# Patient Record
Sex: Female | Born: 1955
Health system: Southern US, Community
[De-identification: ages and names within clinical notes are randomized; demographics above are authoritative.]

## PROBLEM LIST (undated history)

## (undated) DIAGNOSIS — E785 Hyperlipidemia, unspecified: Secondary | ICD-10-CM

## (undated) DIAGNOSIS — N186 End stage renal disease: Secondary | ICD-10-CM

## (undated) DIAGNOSIS — F329 Major depressive disorder, single episode, unspecified: Secondary | ICD-10-CM

## (undated) DIAGNOSIS — I10 Essential (primary) hypertension: Secondary | ICD-10-CM

## (undated) DIAGNOSIS — M109 Gout, unspecified: Secondary | ICD-10-CM

## (undated) DIAGNOSIS — M329 Systemic lupus erythematosus, unspecified: Secondary | ICD-10-CM

## (undated) DIAGNOSIS — E559 Vitamin D deficiency, unspecified: Secondary | ICD-10-CM

## (undated) HISTORY — PX: TUBAL LIGATION: SHX77

---

## 2011-08-22 DIAGNOSIS — N2581 Secondary hyperparathyroidism of renal origin: Secondary | ICD-10-CM | POA: Insufficient documentation

## 2011-08-22 DIAGNOSIS — E669 Obesity, unspecified: Secondary | ICD-10-CM | POA: Insufficient documentation

## 2011-08-27 DIAGNOSIS — I951 Orthostatic hypotension: Secondary | ICD-10-CM | POA: Insufficient documentation

## 2014-01-10 DIAGNOSIS — J309 Allergic rhinitis, unspecified: Secondary | ICD-10-CM | POA: Insufficient documentation

## 2014-01-10 DIAGNOSIS — E785 Hyperlipidemia, unspecified: Secondary | ICD-10-CM | POA: Insufficient documentation

## 2014-01-10 DIAGNOSIS — IMO0001 Reserved for inherently not codable concepts without codable children: Secondary | ICD-10-CM | POA: Insufficient documentation

## 2014-01-10 DIAGNOSIS — E559 Vitamin D deficiency, unspecified: Secondary | ICD-10-CM | POA: Insufficient documentation

## 2014-01-10 DIAGNOSIS — M329 Systemic lupus erythematosus, unspecified: Secondary | ICD-10-CM | POA: Insufficient documentation

## 2014-01-10 DIAGNOSIS — F17200 Nicotine dependence, unspecified, uncomplicated: Secondary | ICD-10-CM | POA: Insufficient documentation

## 2014-01-13 DIAGNOSIS — R87613 High grade squamous intraepithelial lesion on cytologic smear of cervix (HGSIL): Secondary | ICD-10-CM | POA: Insufficient documentation

## 2014-04-19 DIAGNOSIS — K828 Other specified diseases of gallbladder: Secondary | ICD-10-CM | POA: Insufficient documentation

## 2014-05-07 DIAGNOSIS — G4700 Insomnia, unspecified: Secondary | ICD-10-CM | POA: Insufficient documentation

## 2014-05-07 DIAGNOSIS — F331 Major depressive disorder, recurrent, moderate: Secondary | ICD-10-CM | POA: Insufficient documentation

## 2014-09-01 DIAGNOSIS — M10371 Gout due to renal impairment, right ankle and foot: Secondary | ICD-10-CM | POA: Insufficient documentation

## 2016-10-01 DIAGNOSIS — N186 End stage renal disease: Secondary | ICD-10-CM | POA: Diagnosis not present

## 2016-10-01 DIAGNOSIS — N2581 Secondary hyperparathyroidism of renal origin: Secondary | ICD-10-CM | POA: Diagnosis not present

## 2016-10-03 DIAGNOSIS — N2581 Secondary hyperparathyroidism of renal origin: Secondary | ICD-10-CM | POA: Diagnosis not present

## 2016-10-03 DIAGNOSIS — N186 End stage renal disease: Secondary | ICD-10-CM | POA: Diagnosis not present

## 2016-10-15 DIAGNOSIS — N186 End stage renal disease: Secondary | ICD-10-CM | POA: Diagnosis not present

## 2016-10-15 DIAGNOSIS — N2581 Secondary hyperparathyroidism of renal origin: Secondary | ICD-10-CM | POA: Diagnosis not present

## 2016-10-17 DIAGNOSIS — N186 End stage renal disease: Secondary | ICD-10-CM | POA: Diagnosis not present

## 2016-10-17 DIAGNOSIS — N2581 Secondary hyperparathyroidism of renal origin: Secondary | ICD-10-CM | POA: Diagnosis not present

## 2016-10-22 DIAGNOSIS — N186 End stage renal disease: Secondary | ICD-10-CM | POA: Diagnosis not present

## 2016-10-22 DIAGNOSIS — N2581 Secondary hyperparathyroidism of renal origin: Secondary | ICD-10-CM | POA: Diagnosis not present

## 2016-10-26 DIAGNOSIS — N186 End stage renal disease: Secondary | ICD-10-CM | POA: Diagnosis not present

## 2016-10-29 DIAGNOSIS — N2581 Secondary hyperparathyroidism of renal origin: Secondary | ICD-10-CM | POA: Diagnosis not present

## 2016-10-29 DIAGNOSIS — N186 End stage renal disease: Secondary | ICD-10-CM | POA: Diagnosis not present

## 2016-11-05 DIAGNOSIS — N186 End stage renal disease: Secondary | ICD-10-CM | POA: Diagnosis not present

## 2016-11-05 DIAGNOSIS — N2581 Secondary hyperparathyroidism of renal origin: Secondary | ICD-10-CM | POA: Diagnosis not present

## 2016-11-07 DIAGNOSIS — N186 End stage renal disease: Secondary | ICD-10-CM | POA: Diagnosis not present

## 2016-11-07 DIAGNOSIS — N2581 Secondary hyperparathyroidism of renal origin: Secondary | ICD-10-CM | POA: Diagnosis not present

## 2016-11-19 DIAGNOSIS — N186 End stage renal disease: Secondary | ICD-10-CM | POA: Diagnosis not present

## 2016-11-19 DIAGNOSIS — N2581 Secondary hyperparathyroidism of renal origin: Secondary | ICD-10-CM | POA: Diagnosis not present

## 2016-11-25 DIAGNOSIS — N186 End stage renal disease: Secondary | ICD-10-CM | POA: Diagnosis not present

## 2016-11-28 DIAGNOSIS — N2581 Secondary hyperparathyroidism of renal origin: Secondary | ICD-10-CM | POA: Diagnosis not present

## 2016-11-28 DIAGNOSIS — N186 End stage renal disease: Secondary | ICD-10-CM | POA: Diagnosis not present

## 2016-11-28 DIAGNOSIS — D631 Anemia in chronic kidney disease: Secondary | ICD-10-CM | POA: Diagnosis not present

## 2016-12-05 DIAGNOSIS — N186 End stage renal disease: Secondary | ICD-10-CM | POA: Diagnosis not present

## 2016-12-05 DIAGNOSIS — D631 Anemia in chronic kidney disease: Secondary | ICD-10-CM | POA: Diagnosis not present

## 2016-12-05 DIAGNOSIS — N2581 Secondary hyperparathyroidism of renal origin: Secondary | ICD-10-CM | POA: Diagnosis not present

## 2016-12-19 DIAGNOSIS — N2581 Secondary hyperparathyroidism of renal origin: Secondary | ICD-10-CM | POA: Diagnosis not present

## 2016-12-19 DIAGNOSIS — N186 End stage renal disease: Secondary | ICD-10-CM | POA: Diagnosis not present

## 2016-12-19 DIAGNOSIS — D631 Anemia in chronic kidney disease: Secondary | ICD-10-CM | POA: Diagnosis not present

## 2016-12-26 DIAGNOSIS — N2581 Secondary hyperparathyroidism of renal origin: Secondary | ICD-10-CM | POA: Diagnosis not present

## 2016-12-26 DIAGNOSIS — N186 End stage renal disease: Secondary | ICD-10-CM | POA: Diagnosis not present

## 2016-12-26 DIAGNOSIS — D631 Anemia in chronic kidney disease: Secondary | ICD-10-CM | POA: Diagnosis not present

## 2016-12-27 DIAGNOSIS — F3341 Major depressive disorder, recurrent, in partial remission: Secondary | ICD-10-CM | POA: Diagnosis not present

## 2016-12-27 DIAGNOSIS — Z992 Dependence on renal dialysis: Secondary | ICD-10-CM | POA: Diagnosis not present

## 2016-12-27 DIAGNOSIS — I10 Essential (primary) hypertension: Secondary | ICD-10-CM | POA: Diagnosis not present

## 2016-12-27 DIAGNOSIS — E785 Hyperlipidemia, unspecified: Secondary | ICD-10-CM | POA: Diagnosis not present

## 2016-12-27 DIAGNOSIS — M1A30X0 Chronic gout due to renal impairment, unspecified site, without tophus (tophi): Secondary | ICD-10-CM | POA: Diagnosis not present

## 2016-12-27 DIAGNOSIS — M329 Systemic lupus erythematosus, unspecified: Secondary | ICD-10-CM | POA: Diagnosis not present

## 2016-12-27 DIAGNOSIS — Z1211 Encounter for screening for malignant neoplasm of colon: Secondary | ICD-10-CM | POA: Diagnosis not present

## 2016-12-27 DIAGNOSIS — R87613 High grade squamous intraepithelial lesion on cytologic smear of cervix (HGSIL): Secondary | ICD-10-CM | POA: Diagnosis not present

## 2016-12-27 DIAGNOSIS — N186 End stage renal disease: Secondary | ICD-10-CM | POA: Diagnosis not present

## 2016-12-27 DIAGNOSIS — H1013 Acute atopic conjunctivitis, bilateral: Secondary | ICD-10-CM | POA: Diagnosis not present

## 2016-12-31 DIAGNOSIS — N186 End stage renal disease: Secondary | ICD-10-CM | POA: Diagnosis not present

## 2016-12-31 DIAGNOSIS — N2581 Secondary hyperparathyroidism of renal origin: Secondary | ICD-10-CM | POA: Diagnosis not present

## 2016-12-31 DIAGNOSIS — L299 Pruritus, unspecified: Secondary | ICD-10-CM | POA: Diagnosis not present

## 2016-12-31 DIAGNOSIS — D631 Anemia in chronic kidney disease: Secondary | ICD-10-CM | POA: Diagnosis not present

## 2017-01-02 DIAGNOSIS — L299 Pruritus, unspecified: Secondary | ICD-10-CM | POA: Diagnosis not present

## 2017-01-02 DIAGNOSIS — N186 End stage renal disease: Secondary | ICD-10-CM | POA: Diagnosis not present

## 2017-01-02 DIAGNOSIS — N2581 Secondary hyperparathyroidism of renal origin: Secondary | ICD-10-CM | POA: Diagnosis not present

## 2017-01-02 DIAGNOSIS — D631 Anemia in chronic kidney disease: Secondary | ICD-10-CM | POA: Diagnosis not present

## 2017-01-25 DIAGNOSIS — N186 End stage renal disease: Secondary | ICD-10-CM | POA: Diagnosis not present

## 2017-01-28 DIAGNOSIS — N186 End stage renal disease: Secondary | ICD-10-CM | POA: Diagnosis not present

## 2017-01-28 DIAGNOSIS — N2581 Secondary hyperparathyroidism of renal origin: Secondary | ICD-10-CM | POA: Diagnosis not present

## 2017-01-28 DIAGNOSIS — M899 Disorder of bone, unspecified: Secondary | ICD-10-CM | POA: Diagnosis not present

## 2017-01-28 DIAGNOSIS — D631 Anemia in chronic kidney disease: Secondary | ICD-10-CM | POA: Diagnosis not present

## 2017-02-11 DIAGNOSIS — N2581 Secondary hyperparathyroidism of renal origin: Secondary | ICD-10-CM | POA: Diagnosis not present

## 2017-02-11 DIAGNOSIS — D631 Anemia in chronic kidney disease: Secondary | ICD-10-CM | POA: Diagnosis not present

## 2017-02-11 DIAGNOSIS — N186 End stage renal disease: Secondary | ICD-10-CM | POA: Diagnosis not present

## 2017-02-11 DIAGNOSIS — M899 Disorder of bone, unspecified: Secondary | ICD-10-CM | POA: Diagnosis not present

## 2017-02-13 DIAGNOSIS — N2581 Secondary hyperparathyroidism of renal origin: Secondary | ICD-10-CM | POA: Diagnosis not present

## 2017-02-13 DIAGNOSIS — M899 Disorder of bone, unspecified: Secondary | ICD-10-CM | POA: Diagnosis not present

## 2017-02-13 DIAGNOSIS — N186 End stage renal disease: Secondary | ICD-10-CM | POA: Diagnosis not present

## 2017-02-13 DIAGNOSIS — D631 Anemia in chronic kidney disease: Secondary | ICD-10-CM | POA: Diagnosis not present

## 2017-02-18 DIAGNOSIS — D631 Anemia in chronic kidney disease: Secondary | ICD-10-CM | POA: Diagnosis not present

## 2017-02-18 DIAGNOSIS — N2581 Secondary hyperparathyroidism of renal origin: Secondary | ICD-10-CM | POA: Diagnosis not present

## 2017-02-18 DIAGNOSIS — N186 End stage renal disease: Secondary | ICD-10-CM | POA: Diagnosis not present

## 2017-02-18 DIAGNOSIS — M899 Disorder of bone, unspecified: Secondary | ICD-10-CM | POA: Diagnosis not present

## 2017-02-25 DIAGNOSIS — N186 End stage renal disease: Secondary | ICD-10-CM | POA: Diagnosis not present

## 2017-03-11 DIAGNOSIS — N186 End stage renal disease: Secondary | ICD-10-CM | POA: Diagnosis not present

## 2017-03-11 DIAGNOSIS — M899 Disorder of bone, unspecified: Secondary | ICD-10-CM | POA: Diagnosis not present

## 2017-03-11 DIAGNOSIS — D631 Anemia in chronic kidney disease: Secondary | ICD-10-CM | POA: Diagnosis not present

## 2017-03-11 DIAGNOSIS — Z139 Encounter for screening, unspecified: Secondary | ICD-10-CM | POA: Diagnosis not present

## 2017-03-11 DIAGNOSIS — N2581 Secondary hyperparathyroidism of renal origin: Secondary | ICD-10-CM | POA: Diagnosis not present

## 2017-03-13 DIAGNOSIS — D631 Anemia in chronic kidney disease: Secondary | ICD-10-CM | POA: Diagnosis not present

## 2017-03-13 DIAGNOSIS — M899 Disorder of bone, unspecified: Secondary | ICD-10-CM | POA: Diagnosis not present

## 2017-03-13 DIAGNOSIS — L859 Epidermal thickening, unspecified: Secondary | ICD-10-CM | POA: Diagnosis not present

## 2017-03-13 DIAGNOSIS — M329 Systemic lupus erythematosus, unspecified: Secondary | ICD-10-CM | POA: Diagnosis not present

## 2017-03-13 DIAGNOSIS — N186 End stage renal disease: Secondary | ICD-10-CM | POA: Diagnosis not present

## 2017-03-13 DIAGNOSIS — M5440 Lumbago with sciatica, unspecified side: Secondary | ICD-10-CM | POA: Diagnosis not present

## 2017-03-13 DIAGNOSIS — G8929 Other chronic pain: Secondary | ICD-10-CM | POA: Diagnosis not present

## 2017-03-13 DIAGNOSIS — M47816 Spondylosis without myelopathy or radiculopathy, lumbar region: Secondary | ICD-10-CM | POA: Diagnosis not present

## 2017-03-13 DIAGNOSIS — Z139 Encounter for screening, unspecified: Secondary | ICD-10-CM | POA: Diagnosis not present

## 2017-03-13 DIAGNOSIS — N2581 Secondary hyperparathyroidism of renal origin: Secondary | ICD-10-CM | POA: Diagnosis not present

## 2017-03-16 DIAGNOSIS — M47816 Spondylosis without myelopathy or radiculopathy, lumbar region: Secondary | ICD-10-CM | POA: Insufficient documentation

## 2017-03-18 DIAGNOSIS — N186 End stage renal disease: Secondary | ICD-10-CM | POA: Diagnosis not present

## 2017-03-18 DIAGNOSIS — Z992 Dependence on renal dialysis: Secondary | ICD-10-CM | POA: Diagnosis not present

## 2017-03-18 DIAGNOSIS — I129 Hypertensive chronic kidney disease with stage 1 through stage 4 chronic kidney disease, or unspecified chronic kidney disease: Secondary | ICD-10-CM | POA: Diagnosis not present

## 2017-03-18 DIAGNOSIS — N2581 Secondary hyperparathyroidism of renal origin: Secondary | ICD-10-CM | POA: Diagnosis not present

## 2017-03-20 DIAGNOSIS — N2581 Secondary hyperparathyroidism of renal origin: Secondary | ICD-10-CM | POA: Diagnosis not present

## 2017-03-20 DIAGNOSIS — N186 End stage renal disease: Secondary | ICD-10-CM | POA: Diagnosis not present

## 2017-03-22 DIAGNOSIS — N186 End stage renal disease: Secondary | ICD-10-CM | POA: Diagnosis not present

## 2017-03-22 DIAGNOSIS — N2581 Secondary hyperparathyroidism of renal origin: Secondary | ICD-10-CM | POA: Diagnosis not present

## 2017-03-26 DIAGNOSIS — N186 End stage renal disease: Secondary | ICD-10-CM | POA: Diagnosis not present

## 2017-03-26 DIAGNOSIS — N2581 Secondary hyperparathyroidism of renal origin: Secondary | ICD-10-CM | POA: Diagnosis not present

## 2017-03-28 DIAGNOSIS — N186 End stage renal disease: Secondary | ICD-10-CM | POA: Diagnosis not present

## 2017-03-29 DIAGNOSIS — N186 End stage renal disease: Secondary | ICD-10-CM | POA: Diagnosis not present

## 2017-03-29 DIAGNOSIS — N2581 Secondary hyperparathyroidism of renal origin: Secondary | ICD-10-CM | POA: Diagnosis not present

## 2017-04-01 DIAGNOSIS — N186 End stage renal disease: Secondary | ICD-10-CM | POA: Diagnosis not present

## 2017-04-01 DIAGNOSIS — D631 Anemia in chronic kidney disease: Secondary | ICD-10-CM | POA: Diagnosis not present

## 2017-04-01 DIAGNOSIS — N2581 Secondary hyperparathyroidism of renal origin: Secondary | ICD-10-CM | POA: Diagnosis not present

## 2017-04-03 DIAGNOSIS — N2581 Secondary hyperparathyroidism of renal origin: Secondary | ICD-10-CM | POA: Diagnosis not present

## 2017-04-03 DIAGNOSIS — D631 Anemia in chronic kidney disease: Secondary | ICD-10-CM | POA: Diagnosis not present

## 2017-04-03 DIAGNOSIS — N186 End stage renal disease: Secondary | ICD-10-CM | POA: Diagnosis not present

## 2017-04-08 DIAGNOSIS — D631 Anemia in chronic kidney disease: Secondary | ICD-10-CM | POA: Diagnosis not present

## 2017-04-08 DIAGNOSIS — N186 End stage renal disease: Secondary | ICD-10-CM | POA: Diagnosis not present

## 2017-04-08 DIAGNOSIS — N2581 Secondary hyperparathyroidism of renal origin: Secondary | ICD-10-CM | POA: Diagnosis not present

## 2017-04-10 DIAGNOSIS — D631 Anemia in chronic kidney disease: Secondary | ICD-10-CM | POA: Diagnosis not present

## 2017-04-10 DIAGNOSIS — N2581 Secondary hyperparathyroidism of renal origin: Secondary | ICD-10-CM | POA: Diagnosis not present

## 2017-04-10 DIAGNOSIS — N186 End stage renal disease: Secondary | ICD-10-CM | POA: Diagnosis not present

## 2017-04-12 DIAGNOSIS — N186 End stage renal disease: Secondary | ICD-10-CM | POA: Diagnosis not present

## 2017-04-12 DIAGNOSIS — N2581 Secondary hyperparathyroidism of renal origin: Secondary | ICD-10-CM | POA: Diagnosis not present

## 2017-04-12 DIAGNOSIS — D631 Anemia in chronic kidney disease: Secondary | ICD-10-CM | POA: Diagnosis not present

## 2017-04-17 DIAGNOSIS — D631 Anemia in chronic kidney disease: Secondary | ICD-10-CM | POA: Diagnosis not present

## 2017-04-17 DIAGNOSIS — N2581 Secondary hyperparathyroidism of renal origin: Secondary | ICD-10-CM | POA: Diagnosis not present

## 2017-04-17 DIAGNOSIS — N186 End stage renal disease: Secondary | ICD-10-CM | POA: Diagnosis not present

## 2017-04-19 DIAGNOSIS — N186 End stage renal disease: Secondary | ICD-10-CM | POA: Diagnosis not present

## 2017-04-19 DIAGNOSIS — D631 Anemia in chronic kidney disease: Secondary | ICD-10-CM | POA: Diagnosis not present

## 2017-04-19 DIAGNOSIS — N2581 Secondary hyperparathyroidism of renal origin: Secondary | ICD-10-CM | POA: Diagnosis not present

## 2017-04-22 DIAGNOSIS — N186 End stage renal disease: Secondary | ICD-10-CM | POA: Diagnosis not present

## 2017-04-22 DIAGNOSIS — D631 Anemia in chronic kidney disease: Secondary | ICD-10-CM | POA: Diagnosis not present

## 2017-04-22 DIAGNOSIS — N2581 Secondary hyperparathyroidism of renal origin: Secondary | ICD-10-CM | POA: Diagnosis not present

## 2017-04-24 DIAGNOSIS — D631 Anemia in chronic kidney disease: Secondary | ICD-10-CM | POA: Diagnosis not present

## 2017-04-24 DIAGNOSIS — N186 End stage renal disease: Secondary | ICD-10-CM | POA: Diagnosis not present

## 2017-04-24 DIAGNOSIS — N2581 Secondary hyperparathyroidism of renal origin: Secondary | ICD-10-CM | POA: Diagnosis not present

## 2017-04-26 DIAGNOSIS — N2581 Secondary hyperparathyroidism of renal origin: Secondary | ICD-10-CM | POA: Diagnosis not present

## 2017-04-26 DIAGNOSIS — D631 Anemia in chronic kidney disease: Secondary | ICD-10-CM | POA: Diagnosis not present

## 2017-04-26 DIAGNOSIS — N186 End stage renal disease: Secondary | ICD-10-CM | POA: Diagnosis not present

## 2017-04-27 DIAGNOSIS — N186 End stage renal disease: Secondary | ICD-10-CM | POA: Diagnosis not present

## 2017-04-29 DIAGNOSIS — L299 Pruritus, unspecified: Secondary | ICD-10-CM | POA: Diagnosis not present

## 2017-04-29 DIAGNOSIS — N186 End stage renal disease: Secondary | ICD-10-CM | POA: Diagnosis not present

## 2017-04-29 DIAGNOSIS — D631 Anemia in chronic kidney disease: Secondary | ICD-10-CM | POA: Diagnosis not present

## 2017-04-29 DIAGNOSIS — M899 Disorder of bone, unspecified: Secondary | ICD-10-CM | POA: Diagnosis not present

## 2017-04-29 DIAGNOSIS — N2581 Secondary hyperparathyroidism of renal origin: Secondary | ICD-10-CM | POA: Diagnosis not present

## 2017-05-03 DIAGNOSIS — N2581 Secondary hyperparathyroidism of renal origin: Secondary | ICD-10-CM | POA: Diagnosis not present

## 2017-05-03 DIAGNOSIS — M899 Disorder of bone, unspecified: Secondary | ICD-10-CM | POA: Diagnosis not present

## 2017-05-03 DIAGNOSIS — N186 End stage renal disease: Secondary | ICD-10-CM | POA: Diagnosis not present

## 2017-05-03 DIAGNOSIS — L299 Pruritus, unspecified: Secondary | ICD-10-CM | POA: Diagnosis not present

## 2017-05-03 DIAGNOSIS — D631 Anemia in chronic kidney disease: Secondary | ICD-10-CM | POA: Diagnosis not present

## 2017-05-08 DIAGNOSIS — D631 Anemia in chronic kidney disease: Secondary | ICD-10-CM | POA: Diagnosis not present

## 2017-05-08 DIAGNOSIS — N186 End stage renal disease: Secondary | ICD-10-CM | POA: Diagnosis not present

## 2017-05-08 DIAGNOSIS — N2581 Secondary hyperparathyroidism of renal origin: Secondary | ICD-10-CM | POA: Diagnosis not present

## 2017-05-08 DIAGNOSIS — L299 Pruritus, unspecified: Secondary | ICD-10-CM | POA: Diagnosis not present

## 2017-05-08 DIAGNOSIS — M899 Disorder of bone, unspecified: Secondary | ICD-10-CM | POA: Diagnosis not present

## 2017-05-10 DIAGNOSIS — L299 Pruritus, unspecified: Secondary | ICD-10-CM | POA: Diagnosis not present

## 2017-05-10 DIAGNOSIS — N2581 Secondary hyperparathyroidism of renal origin: Secondary | ICD-10-CM | POA: Diagnosis not present

## 2017-05-10 DIAGNOSIS — M899 Disorder of bone, unspecified: Secondary | ICD-10-CM | POA: Diagnosis not present

## 2017-05-10 DIAGNOSIS — D631 Anemia in chronic kidney disease: Secondary | ICD-10-CM | POA: Diagnosis not present

## 2017-05-10 DIAGNOSIS — N186 End stage renal disease: Secondary | ICD-10-CM | POA: Diagnosis not present

## 2017-05-15 DIAGNOSIS — N186 End stage renal disease: Secondary | ICD-10-CM | POA: Diagnosis not present

## 2017-05-15 DIAGNOSIS — M899 Disorder of bone, unspecified: Secondary | ICD-10-CM | POA: Diagnosis not present

## 2017-05-15 DIAGNOSIS — L299 Pruritus, unspecified: Secondary | ICD-10-CM | POA: Diagnosis not present

## 2017-05-15 DIAGNOSIS — D631 Anemia in chronic kidney disease: Secondary | ICD-10-CM | POA: Diagnosis not present

## 2017-05-15 DIAGNOSIS — N2581 Secondary hyperparathyroidism of renal origin: Secondary | ICD-10-CM | POA: Diagnosis not present

## 2017-05-17 DIAGNOSIS — D631 Anemia in chronic kidney disease: Secondary | ICD-10-CM | POA: Diagnosis not present

## 2017-05-17 DIAGNOSIS — N2581 Secondary hyperparathyroidism of renal origin: Secondary | ICD-10-CM | POA: Diagnosis not present

## 2017-05-17 DIAGNOSIS — L299 Pruritus, unspecified: Secondary | ICD-10-CM | POA: Diagnosis not present

## 2017-05-17 DIAGNOSIS — M899 Disorder of bone, unspecified: Secondary | ICD-10-CM | POA: Diagnosis not present

## 2017-05-17 DIAGNOSIS — N186 End stage renal disease: Secondary | ICD-10-CM | POA: Diagnosis not present

## 2017-05-20 DIAGNOSIS — D631 Anemia in chronic kidney disease: Secondary | ICD-10-CM | POA: Diagnosis not present

## 2017-05-20 DIAGNOSIS — N2581 Secondary hyperparathyroidism of renal origin: Secondary | ICD-10-CM | POA: Diagnosis not present

## 2017-05-20 DIAGNOSIS — L299 Pruritus, unspecified: Secondary | ICD-10-CM | POA: Diagnosis not present

## 2017-05-20 DIAGNOSIS — N186 End stage renal disease: Secondary | ICD-10-CM | POA: Diagnosis not present

## 2017-05-20 DIAGNOSIS — M899 Disorder of bone, unspecified: Secondary | ICD-10-CM | POA: Diagnosis not present

## 2017-05-22 DIAGNOSIS — L299 Pruritus, unspecified: Secondary | ICD-10-CM | POA: Diagnosis not present

## 2017-05-22 DIAGNOSIS — N186 End stage renal disease: Secondary | ICD-10-CM | POA: Diagnosis not present

## 2017-05-22 DIAGNOSIS — D631 Anemia in chronic kidney disease: Secondary | ICD-10-CM | POA: Diagnosis not present

## 2017-05-22 DIAGNOSIS — N2581 Secondary hyperparathyroidism of renal origin: Secondary | ICD-10-CM | POA: Diagnosis not present

## 2017-05-22 DIAGNOSIS — M899 Disorder of bone, unspecified: Secondary | ICD-10-CM | POA: Diagnosis not present

## 2017-05-24 DIAGNOSIS — N186 End stage renal disease: Secondary | ICD-10-CM | POA: Diagnosis not present

## 2017-05-24 DIAGNOSIS — N2581 Secondary hyperparathyroidism of renal origin: Secondary | ICD-10-CM | POA: Diagnosis not present

## 2017-05-24 DIAGNOSIS — D631 Anemia in chronic kidney disease: Secondary | ICD-10-CM | POA: Diagnosis not present

## 2017-05-24 DIAGNOSIS — L299 Pruritus, unspecified: Secondary | ICD-10-CM | POA: Diagnosis not present

## 2017-05-24 DIAGNOSIS — M899 Disorder of bone, unspecified: Secondary | ICD-10-CM | POA: Diagnosis not present

## 2017-05-27 DIAGNOSIS — N186 End stage renal disease: Secondary | ICD-10-CM | POA: Diagnosis not present

## 2017-05-27 DIAGNOSIS — N2581 Secondary hyperparathyroidism of renal origin: Secondary | ICD-10-CM | POA: Diagnosis not present

## 2017-05-27 DIAGNOSIS — L299 Pruritus, unspecified: Secondary | ICD-10-CM | POA: Diagnosis not present

## 2017-05-27 DIAGNOSIS — M899 Disorder of bone, unspecified: Secondary | ICD-10-CM | POA: Diagnosis not present

## 2017-05-27 DIAGNOSIS — D631 Anemia in chronic kidney disease: Secondary | ICD-10-CM | POA: Diagnosis not present

## 2017-05-28 DIAGNOSIS — N186 End stage renal disease: Secondary | ICD-10-CM | POA: Diagnosis not present

## 2017-06-03 DIAGNOSIS — N186 End stage renal disease: Secondary | ICD-10-CM | POA: Diagnosis not present

## 2017-06-03 DIAGNOSIS — N2581 Secondary hyperparathyroidism of renal origin: Secondary | ICD-10-CM | POA: Diagnosis not present

## 2017-06-03 DIAGNOSIS — D631 Anemia in chronic kidney disease: Secondary | ICD-10-CM | POA: Diagnosis not present

## 2017-06-07 DIAGNOSIS — N186 End stage renal disease: Secondary | ICD-10-CM | POA: Diagnosis not present

## 2017-06-07 DIAGNOSIS — N2581 Secondary hyperparathyroidism of renal origin: Secondary | ICD-10-CM | POA: Diagnosis not present

## 2017-06-07 DIAGNOSIS — D631 Anemia in chronic kidney disease: Secondary | ICD-10-CM | POA: Diagnosis not present

## 2017-06-11 DIAGNOSIS — J209 Acute bronchitis, unspecified: Secondary | ICD-10-CM | POA: Diagnosis not present

## 2017-06-11 DIAGNOSIS — F17200 Nicotine dependence, unspecified, uncomplicated: Secondary | ICD-10-CM | POA: Diagnosis not present

## 2017-06-11 DIAGNOSIS — J302 Other seasonal allergic rhinitis: Secondary | ICD-10-CM | POA: Diagnosis not present

## 2017-06-11 DIAGNOSIS — Z1211 Encounter for screening for malignant neoplasm of colon: Secondary | ICD-10-CM | POA: Diagnosis not present

## 2017-06-11 DIAGNOSIS — N870 Mild cervical dysplasia: Secondary | ICD-10-CM | POA: Diagnosis not present

## 2017-06-11 DIAGNOSIS — M329 Systemic lupus erythematosus, unspecified: Secondary | ICD-10-CM | POA: Diagnosis not present

## 2017-06-18 DIAGNOSIS — N186 End stage renal disease: Secondary | ICD-10-CM | POA: Diagnosis not present

## 2017-06-18 DIAGNOSIS — D631 Anemia in chronic kidney disease: Secondary | ICD-10-CM | POA: Diagnosis not present

## 2017-06-18 DIAGNOSIS — N2581 Secondary hyperparathyroidism of renal origin: Secondary | ICD-10-CM | POA: Diagnosis not present

## 2017-06-26 DIAGNOSIS — N2581 Secondary hyperparathyroidism of renal origin: Secondary | ICD-10-CM | POA: Diagnosis not present

## 2017-06-26 DIAGNOSIS — D631 Anemia in chronic kidney disease: Secondary | ICD-10-CM | POA: Diagnosis not present

## 2017-06-26 DIAGNOSIS — N186 End stage renal disease: Secondary | ICD-10-CM | POA: Diagnosis not present

## 2017-06-27 DIAGNOSIS — N186 End stage renal disease: Secondary | ICD-10-CM | POA: Diagnosis not present

## 2017-07-01 DIAGNOSIS — N186 End stage renal disease: Secondary | ICD-10-CM | POA: Diagnosis not present

## 2017-07-01 DIAGNOSIS — D631 Anemia in chronic kidney disease: Secondary | ICD-10-CM | POA: Diagnosis not present

## 2017-07-01 DIAGNOSIS — N2581 Secondary hyperparathyroidism of renal origin: Secondary | ICD-10-CM | POA: Diagnosis not present

## 2017-07-03 DIAGNOSIS — D631 Anemia in chronic kidney disease: Secondary | ICD-10-CM | POA: Diagnosis not present

## 2017-07-03 DIAGNOSIS — N186 End stage renal disease: Secondary | ICD-10-CM | POA: Diagnosis not present

## 2017-07-03 DIAGNOSIS — N2581 Secondary hyperparathyroidism of renal origin: Secondary | ICD-10-CM | POA: Diagnosis not present

## 2017-07-10 DIAGNOSIS — N186 End stage renal disease: Secondary | ICD-10-CM | POA: Diagnosis not present

## 2017-07-10 DIAGNOSIS — D631 Anemia in chronic kidney disease: Secondary | ICD-10-CM | POA: Diagnosis not present

## 2017-07-10 DIAGNOSIS — N2581 Secondary hyperparathyroidism of renal origin: Secondary | ICD-10-CM | POA: Diagnosis not present

## 2017-07-12 DIAGNOSIS — N186 End stage renal disease: Secondary | ICD-10-CM | POA: Diagnosis not present

## 2017-07-12 DIAGNOSIS — D631 Anemia in chronic kidney disease: Secondary | ICD-10-CM | POA: Diagnosis not present

## 2017-07-12 DIAGNOSIS — N2581 Secondary hyperparathyroidism of renal origin: Secondary | ICD-10-CM | POA: Diagnosis not present

## 2017-07-19 DIAGNOSIS — D631 Anemia in chronic kidney disease: Secondary | ICD-10-CM | POA: Diagnosis not present

## 2017-07-19 DIAGNOSIS — N186 End stage renal disease: Secondary | ICD-10-CM | POA: Diagnosis not present

## 2017-07-19 DIAGNOSIS — N2581 Secondary hyperparathyroidism of renal origin: Secondary | ICD-10-CM | POA: Diagnosis not present

## 2017-07-28 DIAGNOSIS — N186 End stage renal disease: Secondary | ICD-10-CM | POA: Diagnosis not present

## 2017-07-31 DIAGNOSIS — N186 End stage renal disease: Secondary | ICD-10-CM | POA: Diagnosis not present

## 2017-07-31 DIAGNOSIS — N2581 Secondary hyperparathyroidism of renal origin: Secondary | ICD-10-CM | POA: Diagnosis not present

## 2017-08-02 DIAGNOSIS — N2581 Secondary hyperparathyroidism of renal origin: Secondary | ICD-10-CM | POA: Diagnosis not present

## 2017-08-02 DIAGNOSIS — N186 End stage renal disease: Secondary | ICD-10-CM | POA: Diagnosis not present

## 2017-08-05 DIAGNOSIS — N186 End stage renal disease: Secondary | ICD-10-CM | POA: Diagnosis not present

## 2017-08-05 DIAGNOSIS — N2581 Secondary hyperparathyroidism of renal origin: Secondary | ICD-10-CM | POA: Diagnosis not present

## 2017-08-12 DIAGNOSIS — N2581 Secondary hyperparathyroidism of renal origin: Secondary | ICD-10-CM | POA: Diagnosis not present

## 2017-08-12 DIAGNOSIS — N186 End stage renal disease: Secondary | ICD-10-CM | POA: Diagnosis not present

## 2017-08-23 DIAGNOSIS — N2581 Secondary hyperparathyroidism of renal origin: Secondary | ICD-10-CM | POA: Diagnosis not present

## 2017-08-23 DIAGNOSIS — N186 End stage renal disease: Secondary | ICD-10-CM | POA: Diagnosis not present

## 2017-08-26 DIAGNOSIS — N186 End stage renal disease: Secondary | ICD-10-CM | POA: Diagnosis not present

## 2017-08-26 DIAGNOSIS — N2581 Secondary hyperparathyroidism of renal origin: Secondary | ICD-10-CM | POA: Diagnosis not present

## 2017-08-28 DIAGNOSIS — N186 End stage renal disease: Secondary | ICD-10-CM | POA: Diagnosis not present

## 2017-09-02 DIAGNOSIS — N186 End stage renal disease: Secondary | ICD-10-CM | POA: Diagnosis not present

## 2017-09-02 DIAGNOSIS — N2581 Secondary hyperparathyroidism of renal origin: Secondary | ICD-10-CM | POA: Diagnosis not present

## 2017-09-04 DIAGNOSIS — E789 Disorder of lipoprotein metabolism, unspecified: Secondary | ICD-10-CM | POA: Diagnosis not present

## 2017-09-04 DIAGNOSIS — N2581 Secondary hyperparathyroidism of renal origin: Secondary | ICD-10-CM | POA: Diagnosis not present

## 2017-09-04 DIAGNOSIS — N186 End stage renal disease: Secondary | ICD-10-CM | POA: Diagnosis not present

## 2017-09-11 DIAGNOSIS — N2581 Secondary hyperparathyroidism of renal origin: Secondary | ICD-10-CM | POA: Diagnosis not present

## 2017-09-11 DIAGNOSIS — N186 End stage renal disease: Secondary | ICD-10-CM | POA: Diagnosis not present

## 2017-09-13 DIAGNOSIS — N2581 Secondary hyperparathyroidism of renal origin: Secondary | ICD-10-CM | POA: Diagnosis not present

## 2017-09-13 DIAGNOSIS — N186 End stage renal disease: Secondary | ICD-10-CM | POA: Diagnosis not present

## 2017-09-16 DIAGNOSIS — N186 End stage renal disease: Secondary | ICD-10-CM | POA: Diagnosis not present

## 2017-09-16 DIAGNOSIS — N2581 Secondary hyperparathyroidism of renal origin: Secondary | ICD-10-CM | POA: Diagnosis not present

## 2017-09-23 DIAGNOSIS — N2581 Secondary hyperparathyroidism of renal origin: Secondary | ICD-10-CM | POA: Diagnosis not present

## 2017-09-23 DIAGNOSIS — N186 End stage renal disease: Secondary | ICD-10-CM | POA: Diagnosis not present

## 2017-09-25 DIAGNOSIS — N186 End stage renal disease: Secondary | ICD-10-CM | POA: Diagnosis not present

## 2017-09-25 DIAGNOSIS — N2581 Secondary hyperparathyroidism of renal origin: Secondary | ICD-10-CM | POA: Diagnosis not present

## 2017-10-02 DIAGNOSIS — N2581 Secondary hyperparathyroidism of renal origin: Secondary | ICD-10-CM | POA: Diagnosis not present

## 2017-10-02 DIAGNOSIS — N186 End stage renal disease: Secondary | ICD-10-CM | POA: Diagnosis not present

## 2017-10-09 DIAGNOSIS — N2581 Secondary hyperparathyroidism of renal origin: Secondary | ICD-10-CM | POA: Diagnosis not present

## 2017-10-09 DIAGNOSIS — N186 End stage renal disease: Secondary | ICD-10-CM | POA: Diagnosis not present

## 2017-10-16 DIAGNOSIS — N186 End stage renal disease: Secondary | ICD-10-CM | POA: Diagnosis not present

## 2017-10-16 DIAGNOSIS — N2581 Secondary hyperparathyroidism of renal origin: Secondary | ICD-10-CM | POA: Diagnosis not present

## 2017-10-18 DIAGNOSIS — N186 End stage renal disease: Secondary | ICD-10-CM | POA: Diagnosis not present

## 2017-10-18 DIAGNOSIS — N2581 Secondary hyperparathyroidism of renal origin: Secondary | ICD-10-CM | POA: Diagnosis not present

## 2017-10-21 DIAGNOSIS — N2581 Secondary hyperparathyroidism of renal origin: Secondary | ICD-10-CM | POA: Diagnosis not present

## 2017-10-21 DIAGNOSIS — N186 End stage renal disease: Secondary | ICD-10-CM | POA: Diagnosis not present

## 2017-10-26 DIAGNOSIS — N186 End stage renal disease: Secondary | ICD-10-CM | POA: Diagnosis not present

## 2017-10-28 DIAGNOSIS — N186 End stage renal disease: Secondary | ICD-10-CM | POA: Diagnosis not present

## 2017-10-28 DIAGNOSIS — D631 Anemia in chronic kidney disease: Secondary | ICD-10-CM | POA: Diagnosis not present

## 2017-10-28 DIAGNOSIS — N2581 Secondary hyperparathyroidism of renal origin: Secondary | ICD-10-CM | POA: Diagnosis not present

## 2017-10-30 DIAGNOSIS — D631 Anemia in chronic kidney disease: Secondary | ICD-10-CM | POA: Diagnosis not present

## 2017-10-30 DIAGNOSIS — N2581 Secondary hyperparathyroidism of renal origin: Secondary | ICD-10-CM | POA: Diagnosis not present

## 2017-10-30 DIAGNOSIS — N186 End stage renal disease: Secondary | ICD-10-CM | POA: Diagnosis not present

## 2017-11-06 DIAGNOSIS — D631 Anemia in chronic kidney disease: Secondary | ICD-10-CM | POA: Diagnosis not present

## 2017-11-06 DIAGNOSIS — N186 End stage renal disease: Secondary | ICD-10-CM | POA: Diagnosis not present

## 2017-11-06 DIAGNOSIS — N2581 Secondary hyperparathyroidism of renal origin: Secondary | ICD-10-CM | POA: Diagnosis not present

## 2017-11-08 DIAGNOSIS — D631 Anemia in chronic kidney disease: Secondary | ICD-10-CM | POA: Diagnosis not present

## 2017-11-08 DIAGNOSIS — N2581 Secondary hyperparathyroidism of renal origin: Secondary | ICD-10-CM | POA: Diagnosis not present

## 2017-11-08 DIAGNOSIS — N186 End stage renal disease: Secondary | ICD-10-CM | POA: Diagnosis not present

## 2017-11-11 DIAGNOSIS — D631 Anemia in chronic kidney disease: Secondary | ICD-10-CM | POA: Diagnosis not present

## 2017-11-11 DIAGNOSIS — N2581 Secondary hyperparathyroidism of renal origin: Secondary | ICD-10-CM | POA: Diagnosis not present

## 2017-11-11 DIAGNOSIS — N186 End stage renal disease: Secondary | ICD-10-CM | POA: Diagnosis not present

## 2017-11-15 DIAGNOSIS — N186 End stage renal disease: Secondary | ICD-10-CM | POA: Diagnosis not present

## 2017-11-15 DIAGNOSIS — D631 Anemia in chronic kidney disease: Secondary | ICD-10-CM | POA: Diagnosis not present

## 2017-11-15 DIAGNOSIS — N2581 Secondary hyperparathyroidism of renal origin: Secondary | ICD-10-CM | POA: Diagnosis not present

## 2017-11-20 DIAGNOSIS — D631 Anemia in chronic kidney disease: Secondary | ICD-10-CM | POA: Diagnosis not present

## 2017-11-20 DIAGNOSIS — N2581 Secondary hyperparathyroidism of renal origin: Secondary | ICD-10-CM | POA: Diagnosis not present

## 2017-11-20 DIAGNOSIS — N186 End stage renal disease: Secondary | ICD-10-CM | POA: Diagnosis not present

## 2017-11-25 DIAGNOSIS — N186 End stage renal disease: Secondary | ICD-10-CM | POA: Diagnosis not present

## 2017-11-26 DIAGNOSIS — E875 Hyperkalemia: Secondary | ICD-10-CM | POA: Diagnosis not present

## 2017-11-26 DIAGNOSIS — F17210 Nicotine dependence, cigarettes, uncomplicated: Secondary | ICD-10-CM | POA: Diagnosis not present

## 2017-11-26 DIAGNOSIS — N186 End stage renal disease: Secondary | ICD-10-CM | POA: Diagnosis not present

## 2017-11-26 DIAGNOSIS — R001 Bradycardia, unspecified: Secondary | ICD-10-CM | POA: Diagnosis not present

## 2017-11-26 DIAGNOSIS — I129 Hypertensive chronic kidney disease with stage 1 through stage 4 chronic kidney disease, or unspecified chronic kidney disease: Secondary | ICD-10-CM | POA: Diagnosis not present

## 2017-11-26 DIAGNOSIS — I120 Hypertensive chronic kidney disease with stage 5 chronic kidney disease or end stage renal disease: Secondary | ICD-10-CM | POA: Diagnosis not present

## 2017-11-26 DIAGNOSIS — Z992 Dependence on renal dialysis: Secondary | ICD-10-CM | POA: Diagnosis not present

## 2017-11-27 DIAGNOSIS — N2581 Secondary hyperparathyroidism of renal origin: Secondary | ICD-10-CM | POA: Diagnosis not present

## 2017-11-27 DIAGNOSIS — D631 Anemia in chronic kidney disease: Secondary | ICD-10-CM | POA: Diagnosis not present

## 2017-11-27 DIAGNOSIS — N186 End stage renal disease: Secondary | ICD-10-CM | POA: Diagnosis not present

## 2017-11-29 DIAGNOSIS — D631 Anemia in chronic kidney disease: Secondary | ICD-10-CM | POA: Diagnosis not present

## 2017-11-29 DIAGNOSIS — N186 End stage renal disease: Secondary | ICD-10-CM | POA: Diagnosis not present

## 2017-11-29 DIAGNOSIS — N2581 Secondary hyperparathyroidism of renal origin: Secondary | ICD-10-CM | POA: Diagnosis not present

## 2017-12-02 DIAGNOSIS — N186 End stage renal disease: Secondary | ICD-10-CM | POA: Diagnosis not present

## 2017-12-02 DIAGNOSIS — N2581 Secondary hyperparathyroidism of renal origin: Secondary | ICD-10-CM | POA: Diagnosis not present

## 2017-12-02 DIAGNOSIS — D631 Anemia in chronic kidney disease: Secondary | ICD-10-CM | POA: Diagnosis not present

## 2017-12-04 DIAGNOSIS — N2581 Secondary hyperparathyroidism of renal origin: Secondary | ICD-10-CM | POA: Diagnosis not present

## 2017-12-04 DIAGNOSIS — D631 Anemia in chronic kidney disease: Secondary | ICD-10-CM | POA: Diagnosis not present

## 2017-12-04 DIAGNOSIS — N186 End stage renal disease: Secondary | ICD-10-CM | POA: Diagnosis not present

## 2017-12-06 DIAGNOSIS — N2581 Secondary hyperparathyroidism of renal origin: Secondary | ICD-10-CM | POA: Diagnosis not present

## 2017-12-06 DIAGNOSIS — D631 Anemia in chronic kidney disease: Secondary | ICD-10-CM | POA: Diagnosis not present

## 2017-12-06 DIAGNOSIS — N186 End stage renal disease: Secondary | ICD-10-CM | POA: Diagnosis not present

## 2017-12-11 DIAGNOSIS — N186 End stage renal disease: Secondary | ICD-10-CM | POA: Diagnosis not present

## 2017-12-11 DIAGNOSIS — D631 Anemia in chronic kidney disease: Secondary | ICD-10-CM | POA: Diagnosis not present

## 2017-12-11 DIAGNOSIS — N2581 Secondary hyperparathyroidism of renal origin: Secondary | ICD-10-CM | POA: Diagnosis not present

## 2017-12-13 DIAGNOSIS — N186 End stage renal disease: Secondary | ICD-10-CM | POA: Diagnosis not present

## 2017-12-13 DIAGNOSIS — N2581 Secondary hyperparathyroidism of renal origin: Secondary | ICD-10-CM | POA: Diagnosis not present

## 2017-12-13 DIAGNOSIS — D631 Anemia in chronic kidney disease: Secondary | ICD-10-CM | POA: Diagnosis not present

## 2017-12-16 DIAGNOSIS — N2581 Secondary hyperparathyroidism of renal origin: Secondary | ICD-10-CM | POA: Diagnosis not present

## 2017-12-16 DIAGNOSIS — D631 Anemia in chronic kidney disease: Secondary | ICD-10-CM | POA: Diagnosis not present

## 2017-12-16 DIAGNOSIS — N186 End stage renal disease: Secondary | ICD-10-CM | POA: Diagnosis not present

## 2017-12-18 DIAGNOSIS — D631 Anemia in chronic kidney disease: Secondary | ICD-10-CM | POA: Diagnosis not present

## 2017-12-18 DIAGNOSIS — N2581 Secondary hyperparathyroidism of renal origin: Secondary | ICD-10-CM | POA: Diagnosis not present

## 2017-12-18 DIAGNOSIS — N186 End stage renal disease: Secondary | ICD-10-CM | POA: Diagnosis not present

## 2017-12-23 DIAGNOSIS — D631 Anemia in chronic kidney disease: Secondary | ICD-10-CM | POA: Diagnosis not present

## 2017-12-23 DIAGNOSIS — N2581 Secondary hyperparathyroidism of renal origin: Secondary | ICD-10-CM | POA: Diagnosis not present

## 2017-12-23 DIAGNOSIS — N186 End stage renal disease: Secondary | ICD-10-CM | POA: Diagnosis not present

## 2017-12-25 DIAGNOSIS — N2581 Secondary hyperparathyroidism of renal origin: Secondary | ICD-10-CM | POA: Diagnosis not present

## 2017-12-25 DIAGNOSIS — D631 Anemia in chronic kidney disease: Secondary | ICD-10-CM | POA: Diagnosis not present

## 2017-12-25 DIAGNOSIS — N186 End stage renal disease: Secondary | ICD-10-CM | POA: Diagnosis not present

## 2017-12-27 DIAGNOSIS — D631 Anemia in chronic kidney disease: Secondary | ICD-10-CM | POA: Diagnosis not present

## 2017-12-27 DIAGNOSIS — N186 End stage renal disease: Secondary | ICD-10-CM | POA: Diagnosis not present

## 2017-12-27 DIAGNOSIS — R197 Diarrhea, unspecified: Secondary | ICD-10-CM | POA: Diagnosis not present

## 2017-12-27 DIAGNOSIS — N2581 Secondary hyperparathyroidism of renal origin: Secondary | ICD-10-CM | POA: Diagnosis not present

## 2017-12-30 DIAGNOSIS — N186 End stage renal disease: Secondary | ICD-10-CM | POA: Diagnosis not present

## 2017-12-30 DIAGNOSIS — R197 Diarrhea, unspecified: Secondary | ICD-10-CM | POA: Diagnosis not present

## 2017-12-30 DIAGNOSIS — N2581 Secondary hyperparathyroidism of renal origin: Secondary | ICD-10-CM | POA: Diagnosis not present

## 2017-12-30 DIAGNOSIS — D631 Anemia in chronic kidney disease: Secondary | ICD-10-CM | POA: Diagnosis not present

## 2018-01-01 DIAGNOSIS — N2581 Secondary hyperparathyroidism of renal origin: Secondary | ICD-10-CM | POA: Diagnosis not present

## 2018-01-01 DIAGNOSIS — D631 Anemia in chronic kidney disease: Secondary | ICD-10-CM | POA: Diagnosis not present

## 2018-01-01 DIAGNOSIS — R197 Diarrhea, unspecified: Secondary | ICD-10-CM | POA: Diagnosis not present

## 2018-01-01 DIAGNOSIS — N186 End stage renal disease: Secondary | ICD-10-CM | POA: Diagnosis not present

## 2018-01-03 DIAGNOSIS — N186 End stage renal disease: Secondary | ICD-10-CM | POA: Diagnosis not present

## 2018-01-03 DIAGNOSIS — R197 Diarrhea, unspecified: Secondary | ICD-10-CM | POA: Diagnosis not present

## 2018-01-03 DIAGNOSIS — D631 Anemia in chronic kidney disease: Secondary | ICD-10-CM | POA: Diagnosis not present

## 2018-01-03 DIAGNOSIS — N2581 Secondary hyperparathyroidism of renal origin: Secondary | ICD-10-CM | POA: Diagnosis not present

## 2018-01-04 DIAGNOSIS — J029 Acute pharyngitis, unspecified: Secondary | ICD-10-CM | POA: Diagnosis not present

## 2018-01-04 DIAGNOSIS — J0140 Acute pansinusitis, unspecified: Secondary | ICD-10-CM | POA: Diagnosis not present

## 2018-01-06 DIAGNOSIS — N2581 Secondary hyperparathyroidism of renal origin: Secondary | ICD-10-CM | POA: Diagnosis not present

## 2018-01-06 DIAGNOSIS — N186 End stage renal disease: Secondary | ICD-10-CM | POA: Diagnosis not present

## 2018-01-06 DIAGNOSIS — D631 Anemia in chronic kidney disease: Secondary | ICD-10-CM | POA: Diagnosis not present

## 2018-01-06 DIAGNOSIS — R197 Diarrhea, unspecified: Secondary | ICD-10-CM | POA: Diagnosis not present

## 2018-01-08 DIAGNOSIS — N2581 Secondary hyperparathyroidism of renal origin: Secondary | ICD-10-CM | POA: Diagnosis not present

## 2018-01-08 DIAGNOSIS — N186 End stage renal disease: Secondary | ICD-10-CM | POA: Diagnosis not present

## 2018-01-08 DIAGNOSIS — D631 Anemia in chronic kidney disease: Secondary | ICD-10-CM | POA: Diagnosis not present

## 2018-01-08 DIAGNOSIS — R197 Diarrhea, unspecified: Secondary | ICD-10-CM | POA: Diagnosis not present

## 2018-01-10 DIAGNOSIS — N2581 Secondary hyperparathyroidism of renal origin: Secondary | ICD-10-CM | POA: Diagnosis not present

## 2018-01-10 DIAGNOSIS — N186 End stage renal disease: Secondary | ICD-10-CM | POA: Diagnosis not present

## 2018-01-10 DIAGNOSIS — D631 Anemia in chronic kidney disease: Secondary | ICD-10-CM | POA: Diagnosis not present

## 2018-01-10 DIAGNOSIS — R197 Diarrhea, unspecified: Secondary | ICD-10-CM | POA: Diagnosis not present

## 2018-01-15 DIAGNOSIS — D631 Anemia in chronic kidney disease: Secondary | ICD-10-CM | POA: Diagnosis not present

## 2018-01-15 DIAGNOSIS — N186 End stage renal disease: Secondary | ICD-10-CM | POA: Diagnosis not present

## 2018-01-15 DIAGNOSIS — R197 Diarrhea, unspecified: Secondary | ICD-10-CM | POA: Diagnosis not present

## 2018-01-15 DIAGNOSIS — N2581 Secondary hyperparathyroidism of renal origin: Secondary | ICD-10-CM | POA: Diagnosis not present

## 2018-01-17 DIAGNOSIS — R197 Diarrhea, unspecified: Secondary | ICD-10-CM | POA: Diagnosis not present

## 2018-01-17 DIAGNOSIS — N2581 Secondary hyperparathyroidism of renal origin: Secondary | ICD-10-CM | POA: Diagnosis not present

## 2018-01-17 DIAGNOSIS — D631 Anemia in chronic kidney disease: Secondary | ICD-10-CM | POA: Diagnosis not present

## 2018-01-17 DIAGNOSIS — N186 End stage renal disease: Secondary | ICD-10-CM | POA: Diagnosis not present

## 2018-01-22 DIAGNOSIS — N2581 Secondary hyperparathyroidism of renal origin: Secondary | ICD-10-CM | POA: Diagnosis not present

## 2018-01-22 DIAGNOSIS — D631 Anemia in chronic kidney disease: Secondary | ICD-10-CM | POA: Diagnosis not present

## 2018-01-22 DIAGNOSIS — R197 Diarrhea, unspecified: Secondary | ICD-10-CM | POA: Diagnosis not present

## 2018-01-22 DIAGNOSIS — N186 End stage renal disease: Secondary | ICD-10-CM | POA: Diagnosis not present

## 2018-01-24 DIAGNOSIS — R197 Diarrhea, unspecified: Secondary | ICD-10-CM | POA: Diagnosis not present

## 2018-01-24 DIAGNOSIS — N186 End stage renal disease: Secondary | ICD-10-CM | POA: Diagnosis not present

## 2018-01-24 DIAGNOSIS — D631 Anemia in chronic kidney disease: Secondary | ICD-10-CM | POA: Diagnosis not present

## 2018-01-24 DIAGNOSIS — N2581 Secondary hyperparathyroidism of renal origin: Secondary | ICD-10-CM | POA: Diagnosis not present

## 2018-01-25 DIAGNOSIS — I129 Hypertensive chronic kidney disease with stage 1 through stage 4 chronic kidney disease, or unspecified chronic kidney disease: Secondary | ICD-10-CM | POA: Diagnosis not present

## 2018-01-25 DIAGNOSIS — N186 End stage renal disease: Secondary | ICD-10-CM | POA: Diagnosis not present

## 2018-01-25 DIAGNOSIS — Z992 Dependence on renal dialysis: Secondary | ICD-10-CM | POA: Diagnosis not present

## 2018-02-03 ENCOUNTER — Encounter (HOSPITAL_COMMUNITY): Payer: Self-pay | Admitting: Emergency Medicine

## 2018-02-03 ENCOUNTER — Other Ambulatory Visit: Payer: Self-pay

## 2018-02-03 ENCOUNTER — Observation Stay (HOSPITAL_COMMUNITY)
Admission: EM | Admit: 2018-02-03 | Discharge: 2018-02-04 | Disposition: A | Discharge status: Home / Self Care | Payer: MEDICARE | Attending: Internal Medicine | Admitting: Internal Medicine

## 2018-02-03 ENCOUNTER — Emergency Department (HOSPITAL_COMMUNITY): Payer: MEDICARE

## 2018-02-03 DIAGNOSIS — Z9889 Other specified postprocedural states: Secondary | ICD-10-CM | POA: Insufficient documentation

## 2018-02-03 DIAGNOSIS — E785 Hyperlipidemia, unspecified: Secondary | ICD-10-CM | POA: Insufficient documentation

## 2018-02-03 DIAGNOSIS — Z833 Family history of diabetes mellitus: Secondary | ICD-10-CM | POA: Insufficient documentation

## 2018-02-03 DIAGNOSIS — N186 End stage renal disease: Secondary | ICD-10-CM

## 2018-02-03 DIAGNOSIS — E559 Vitamin D deficiency, unspecified: Secondary | ICD-10-CM | POA: Diagnosis not present

## 2018-02-03 DIAGNOSIS — F17200 Nicotine dependence, unspecified, uncomplicated: Secondary | ICD-10-CM | POA: Insufficient documentation

## 2018-02-03 DIAGNOSIS — D649 Anemia, unspecified: Secondary | ICD-10-CM | POA: Diagnosis not present

## 2018-02-03 DIAGNOSIS — Z992 Dependence on renal dialysis: Secondary | ICD-10-CM | POA: Insufficient documentation

## 2018-02-03 DIAGNOSIS — D638 Anemia in other chronic diseases classified elsewhere: Secondary | ICD-10-CM | POA: Diagnosis not present

## 2018-02-03 DIAGNOSIS — E8889 Other specified metabolic disorders: Secondary | ICD-10-CM | POA: Diagnosis not present

## 2018-02-03 DIAGNOSIS — Z79899 Other long term (current) drug therapy: Secondary | ICD-10-CM | POA: Diagnosis not present

## 2018-02-03 DIAGNOSIS — I700 Atherosclerosis of aorta: Secondary | ICD-10-CM | POA: Diagnosis not present

## 2018-02-03 DIAGNOSIS — E1122 Type 2 diabetes mellitus with diabetic chronic kidney disease: Secondary | ICD-10-CM | POA: Insufficient documentation

## 2018-02-03 DIAGNOSIS — M329 Systemic lupus erythematosus, unspecified: Secondary | ICD-10-CM | POA: Insufficient documentation

## 2018-02-03 DIAGNOSIS — R0600 Dyspnea, unspecified: Secondary | ICD-10-CM | POA: Diagnosis not present

## 2018-02-03 DIAGNOSIS — E875 Hyperkalemia: Secondary | ICD-10-CM | POA: Diagnosis not present

## 2018-02-03 DIAGNOSIS — I10 Essential (primary) hypertension: Secondary | ICD-10-CM | POA: Diagnosis not present

## 2018-02-03 DIAGNOSIS — I517 Cardiomegaly: Secondary | ICD-10-CM | POA: Diagnosis not present

## 2018-02-03 DIAGNOSIS — M109 Gout, unspecified: Secondary | ICD-10-CM | POA: Insufficient documentation

## 2018-02-03 DIAGNOSIS — I120 Hypertensive chronic kidney disease with stage 5 chronic kidney disease or end stage renal disease: Secondary | ICD-10-CM | POA: Insufficient documentation

## 2018-02-03 HISTORY — DX: Major depressive disorder, single episode, unspecified: F32.9

## 2018-02-03 HISTORY — DX: Gout, unspecified: M10.9

## 2018-02-03 HISTORY — DX: End stage renal disease: N18.6

## 2018-02-03 HISTORY — DX: Systemic lupus erythematosus, unspecified: M32.9

## 2018-02-03 HISTORY — DX: Vitamin D deficiency, unspecified: E55.9

## 2018-02-03 HISTORY — DX: Essential (primary) hypertension: I10

## 2018-02-03 HISTORY — DX: Hyperlipidemia, unspecified: E78.5

## 2018-02-03 LAB — CBC WITH DIFFERENTIAL/PLATELET
Abs Immature Granulocytes: 0 10*3/uL (ref 0.0–0.1)
BASOS PCT: 1 %
Basophils Absolute: 0 10*3/uL (ref 0.0–0.1)
EOS ABS: 0.1 10*3/uL (ref 0.0–0.7)
Eosinophils Relative: 2 %
HCT: 37.3 % (ref 36.0–46.0)
Hemoglobin: 11.1 g/dL — ABNORMAL LOW (ref 12.0–15.0)
IMMATURE GRANULOCYTES: 0 %
Lymphocytes Relative: 25 %
Lymphs Abs: 1.1 10*3/uL (ref 0.7–4.0)
MCH: 29.8 pg (ref 26.0–34.0)
MCHC: 29.8 g/dL — ABNORMAL LOW (ref 30.0–36.0)
MCV: 100 fL (ref 78.0–100.0)
MONOS PCT: 8 %
Monocytes Absolute: 0.4 10*3/uL (ref 0.1–1.0)
NEUTROS PCT: 64 %
Neutro Abs: 2.8 10*3/uL (ref 1.7–7.7)
PLATELETS: 150 10*3/uL (ref 150–400)
RBC: 3.73 MIL/uL — ABNORMAL LOW (ref 3.87–5.11)
RDW: 14.3 % (ref 11.5–15.5)
WBC: 4.4 10*3/uL (ref 4.0–10.5)

## 2018-02-03 LAB — BASIC METABOLIC PANEL
ANION GAP: 11 (ref 5–15)
BUN: 104 mg/dL — AB (ref 8–23)
CO2: 17 mmol/L — ABNORMAL LOW (ref 22–32)
Calcium: 9 mg/dL (ref 8.9–10.3)
Chloride: 112 mmol/L — ABNORMAL HIGH (ref 98–111)
Creatinine, Ser: 9.42 mg/dL — ABNORMAL HIGH (ref 0.44–1.00)
GFR calc Af Amer: 5 mL/min — ABNORMAL LOW (ref >60)
GFR, EST NON AFRICAN AMERICAN: 4 mL/min — AB (ref >60)
GLUCOSE: 78 mg/dL (ref 70–99)
Potassium: 6.6 mmol/L (ref 3.5–5.1)
Sodium: 140 mmol/L (ref 135–145)

## 2018-02-03 LAB — MAGNESIUM: Magnesium: 2.3 mg/dL (ref 1.7–2.4)

## 2018-02-03 MED ORDER — ALBUTEROL SULFATE (2.5 MG/3ML) 0.083% IN NEBU
5.0000 mg | INHALATION_SOLUTION | Freq: Once | RESPIRATORY_TRACT | Status: AC
  Administered 2018-02-03: 5 mg via RESPIRATORY_TRACT
  Filled 2018-02-03: qty 6

## 2018-02-03 MED ORDER — AMLODIPINE BESYLATE 5 MG PO TABS
10.0000 mg | ORAL_TABLET | Freq: Once | ORAL | Status: AC
  Administered 2018-02-03: 10 mg via ORAL
  Filled 2018-02-03 (×2): qty 2

## 2018-02-03 MED ORDER — DEXTROSE 50 % IV SOLN
1.0000 | Freq: Once | INTRAVENOUS | Status: AC
  Administered 2018-02-03: 50 mL via INTRAVENOUS
  Filled 2018-02-03: qty 50

## 2018-02-03 MED ORDER — INSULIN ASPART 100 UNIT/ML ~~LOC~~ SOLN
5.0000 [IU] | Freq: Once | SUBCUTANEOUS | Status: AC
  Administered 2018-02-03: 5 [IU] via INTRAVENOUS
  Filled 2018-02-03: qty 1

## 2018-02-03 MED ORDER — SODIUM CHLORIDE 0.9 % IV SOLN
1.0000 g | Freq: Once | INTRAVENOUS | Status: AC
  Administered 2018-02-03: 1 g via INTRAVENOUS
  Filled 2018-02-03: qty 10

## 2018-02-03 MED ORDER — METOPROLOL TARTRATE 25 MG PO TABS
12.5000 mg | ORAL_TABLET | Freq: Once | ORAL | Status: AC
  Administered 2018-02-03: 12.5 mg via ORAL
  Filled 2018-02-03: qty 1

## 2018-02-03 MED ORDER — SODIUM BICARBONATE 8.4 % IV SOLN
50.0000 meq | Freq: Once | INTRAVENOUS | Status: AC
  Administered 2018-02-04: 50 meq via INTRAVENOUS
  Filled 2018-02-03: qty 50

## 2018-02-03 MED ORDER — SODIUM POLYSTYRENE SULFONATE 15 GM/60ML PO SUSP
60.0000 g | Freq: Once | ORAL | Status: DC
Start: 2018-02-04 — End: 2018-02-04
  Filled 2018-02-03: qty 240

## 2018-02-03 MED ORDER — SODIUM POLYSTYRENE SULFONATE 15 GM/60ML PO SUSP
50.0000 g | Freq: Once | ORAL | Status: AC
  Administered 2018-02-03: 50 g via ORAL
  Filled 2018-02-03 (×2): qty 240

## 2018-02-03 NOTE — ED Provider Notes (Addendum)
[facility] EMERGENCY DEPARTMENT Provider Note   CSN: [id number] Arrival date & time: 02/03/18  1400     History   Chief Complaint Chief Complaint  Patient presents with  . Vascular Access Problem    HPI Karen Carr is a 62 y.o. female.  HPI   62 year old female with history of end-stage renal disease, lupus, here with needing dialysis.  Patient states she was out of town for the last week and subsequently did not go to dialysis.  She states she is otherwise completely asymptomatic.  She tried to call her facility to go to dialysis today, and was unable to get in so she presents for evaluation.  She states she feels "great."  She denies any chest pain, shortness of breath, palpitations, cramps, nausea, or vomiting.  She goes to a dialysis center and [place] per her report, but she cannot remember the name of it.  She does not currently have a nephrologist.  She moved from [place] approximately 1 month ago.  Denies any other complaints.  History reviewed. No pertinent past medical history.  There are no active problems to display for this patient.   History reviewed. No pertinent surgical history.   OB History   None      Home Medications    Prior to Admission medications   Medication Sig Start Date End Date Taking? Authorizing Provider  amLODipine (NORVASC) 10 MG tablet Take 10 mg by mouth daily. 12/22/17  Yes [provider]  hydroxychloroquine (PLAQUENIL) 200 MG tablet Take 200 mg by mouth 2 (two) times daily. 12/09/17  Yes [provider]  metoprolol succinate (TOPROL-XL) 25 MG 24 hr tablet Take 25 mg by mouth daily. 12/25/17  Yes [provider]    Family History No family history on file.  Social History Social History   Tobacco Use  . Smoking status: Current Every Day Smoker  . Smokeless tobacco: Current User  Substance Use Topics  . Alcohol use: Not Currently  . Drug use: Not Currently     Allergies     Patient has no known allergies.   Review of Systems Review of Systems  Constitutional: Negative for chills, fatigue and fever.  HENT: Negative for congestion and rhinorrhea.   Eyes: Negative for visual disturbance.  Respiratory: Negative for cough, shortness of breath and wheezing.   Cardiovascular: Negative for chest pain and leg swelling.  Gastrointestinal: Negative for abdominal pain, diarrhea, nausea and vomiting.  Genitourinary: Negative for dysuria and flank pain.  Musculoskeletal: Negative for neck pain and neck stiffness.  Skin: Negative for rash and wound.  Allergic/Immunologic: Negative for immunocompromised state.  Neurological: Negative for syncope, weakness and headaches.  All other systems reviewed and are negative.    Physical Exam Updated Vital Signs BP (!) 149/76   Pulse 69   Temp 98.3 F (36.8 C) (Oral)   Resp 14   Ht 5\' 5"  (1.651 m)   Wt 85.7 kg (189 lb)   SpO2 97%   BMI 31.45 kg/m   Physical Exam  Constitutional: She is oriented to person, place, and time. She appears well-developed and well-nourished. No distress.  HENT:  Head: Normocephalic and atraumatic.  Eyes: Conjunctivae are normal.  Neck: Neck supple.  Cardiovascular: Normal rate, regular rhythm and normal heart sounds. Exam reveals no friction rub.  No murmur heard. Pulmonary/Chest: Effort normal and breath sounds normal. No respiratory distress. She has no wheezes. She has no rales.  Abdominal: She exhibits no distension.  Musculoskeletal:  She exhibits no edema.  AV fistula with palpable thrill, left forearm  Neurological: She is alert and oriented to person, place, and time. She exhibits normal muscle tone.  Skin: Skin is warm. Capillary refill takes less than 2 seconds.  Psychiatric: She has a normal mood and affect.  Nursing note and vitals reviewed.    ED Treatments / Results  Labs (all labs ordered are listed, but only abnormal results are displayed) Labs Reviewed  CBC WITH  DIFFERENTIAL/PLATELET - Abnormal; Notable for the following components:      Result Value   RBC 3.73 (*)    Hemoglobin 11.1 (*)    MCHC 29.8 (*)    All other components within normal limits  BASIC METABOLIC PANEL - Abnormal; Notable for the following components:   Potassium 6.6 (*)    Chloride 112 (*)    CO2 17 (*)    BUN 104 (*)    Creatinine, Ser 9.42 (*)    GFR calc non Af Amer 4 (*)    GFR calc Af Amer 5 (*)    All other components within normal limits  MAGNESIUM    EKG EKG Interpretation  Date/Time:  Tuesday February 03 2018 19:17:58 EDT Ventricular Rate:  60 PR Interval:    QRS Duration: 85 QT Interval:  451 QTC Calculation: 451 R Axis:   23 Text Interpretation:  Sinus rhythm Borderline prolonged PR interval Left atrial enlargement Nonspecific T abnrm, anterolateral leads ST elevation, consider inferior injury No old tracing to compare Confirmed by Duffy Bruce [phone]) on 02/03/2018 8:14:41 PM   Radiology Dg Chest Portable 1 View  Result Date: 02/03/2018 CLINICAL DATA:  Dyspnea and weakness EXAM: PORTABLE CHEST 1 VIEW COMPARISON:  None. FINDINGS: Cardiomegaly with moderate aortic atherosclerosis. Lungs are clear without edema. No effusion or pneumothorax. No acute osseous abnormality. IMPRESSION: Mild cardiomegaly with aortic atherosclerosis. No active pulmonary disease. Electronically Signed   By: Ashley Royalty M.D.   On: 02/03/2018 19:02    Procedures .Critical Care Performed by: Duffy Bruce, MD Authorized by: Duffy Bruce, MD   Critical care provider statement:    Critical care time (minutes):  35   Critical care time was exclusive of:  Separately billable procedures and treating other patients and teaching time   Critical care was necessary to treat or prevent imminent or life-threatening deterioration of the following conditions:  Metabolic crisis   Critical care was time spent personally by me on the following activities:  Development of treatment plan with  patient or surrogate, discussions with consultants, evaluation of patient's response to treatment, examination of patient, obtaining history from patient or surrogate, ordering and performing treatments and interventions, ordering and review of laboratory studies, ordering and review of radiographic studies, pulse oximetry, re-evaluation of patient's condition and review of old charts   I assumed direction of critical care for this patient from another provider in my specialty: no     (including critical care time)  Medications Ordered in ED Medications  sodium polystyrene (KAYEXALATE) 15 GM/60ML suspension 60 g (has no administration in time range)  insulin aspart (novoLOG) injection 5 Units (5 Units Intravenous Given 02/03/18 1907)  dextrose 50 % solution 50 mL (50 mLs Intravenous Given 02/03/18 1907)  calcium gluconate 1 g in sodium chloride 0.9 % 100 mL IVPB (0 g Intravenous Stopped 02/03/18 1938)  albuterol (PROVENTIL) (2.5 MG/3ML) 0.083% nebulizer solution 5 mg (5 mg Nebulization Given 02/03/18 1907)  sodium polystyrene (KAYEXALATE) 15 GM/60ML suspension 50 g (50 g Oral  Given 02/03/18 2022)  metoprolol tartrate (LOPRESSOR) tablet 12.5 mg (12.5 mg Oral Given 02/03/18 2029)  amLODipine (NORVASC) tablet 10 mg (10 mg Oral Given 02/03/18 2031)     Initial Impression / Assessment and Plan / ED Course  I have reviewed the triage vital signs and the nursing notes.  Pertinent labs & imaging results that were available during my care of the patient were reviewed by me and considered in my medical decision making (see chart for details).    62 year old female with past medical history of end-stage renal disease here with request for dialysis.  Patient does have significant hyperkalemia and peaking of T waves.  She was temporized with insulin and albuterol, as well as given calcium and D50.  Discussed with Dr. Jimmy Footman.  He recommends Kayexalate now and in 4 hours and does not feel the patient needs emergent  dialysis if she remains stable.  On further discussion with the patient, she is not sure if she can even get to dialysis in the morning.  Patient remains hyperkalemic with peaking T waves on her telemetry.  Discussed again with Dr. Jimmy Footman, he recommends additional dose of Kayexalate, temporization, and does not feel she needs emergent dialysis tonight. However, given her persistent peaked TW, degree of hyperkalemia, and difficulty getting into HD as an outpt with no set nephrologist here, will admit for continued temporization, telemetry.   Final Clinical Impressions(s) / ED Diagnoses   Final diagnoses:  ESRD (end stage renal disease) ([facility] [facility])  Hyperkalemia      Duffy Bruce, MD 02/03/18 2244    Duffy Bruce, MD 02/04/18 3614    Duffy Bruce, MD 02/04/18 [phone]

## 2018-02-03 NOTE — Progress Notes (Signed)
Called ER RN for report. Room ready.  

## 2018-02-03 NOTE — ED Provider Notes (Signed)
Patient placed in Quick Look pathway, seen and evaluated   Chief Complaint: need dialysis  HPI:   Pt missed dialysis (T-Th-Sat) x 1 week due to going to visit family  ROS: no cp, sob, weakness (one)  Physical Exam:   Gen: No distress  Neuro: Awake and Alert  Skin: Warm    Focused Exam: NAD, heart S1S2 no M/R/G, lungs CTAB   Initiation of care has begun. The patient has been counseled on the process, plan, and necessity for staying for the completion/evaluation, and the remainder of the medical screening examination    Domenic Moras, Hershal Coria 02/03/18 1448    Sherwood Gambler, MD 02/04/18 [phone]

## 2018-02-03 NOTE — ED Triage Notes (Signed)
Pt. Stated, I come here to get dialysis, last dialysis was last Saturday over a week.

## 2018-02-03 NOTE — H&P (Signed)
[facility] H&P   Patient Demographics:    Karen Carr, is a 62 y.o. female  MRN: [id number]   DOB - 1955/10/06  Admit Date - 02/03/2018  Outpatient Primary MD for the patient is Patient, No Pcp Per  Referring MD/NP/PA:   Lindell Noe  Outpatient Specialists:    Patient coming from: home  Chief Complaint  Patient presents with  . Vascular Access Problem      HPI:    Karen Carr  is a 62 y.o. female, w hypertension, hyperlipidemia, dm2, SLE, ESRD on HD in [place], apparently has just recently moved from [place], [place] and missed 3 dialysis treatments and therefore HD center would not do her dialysis today. Pt was told to go to ER for evaluation.  Pt was not having any symptoms of uremia.  Last dialysis 1 week ago.   In Ed,  CXR IMPRESSION: Mild cardiomegaly with aortic atherosclerosis. No active pulmonary disease.  Wbc 4.4, Hgb 11.1, Plt 150 Na 140, K 6.6,  Bun 104, Creatinine 9.42 Magnesium 2.3  Pt will be admitted for hyperkalemia.     Review of systems:    In addition to the HPI above,  No Fever-chills, No Headache, No changes with Vision or hearing, No problems swallowing food or Liquids, No Chest pain, Cough or Shortness of Breath, No Abdominal pain, No Nausea or Vommitting, Bowel movements are regular, No Blood in stool or Urine, No dysuria, No new skin rashes or bruises, No new joints pains-aches,  No new weakness, tingling, numbness in any extremity, No recent weight gain or loss, No polyuria, polydypsia or polyphagia, No significant Mental Stressors.  A full 10 point Review of Systems was done, except as stated above, all other Review of Systems were negative.   With Past History of the following :    Past Medical History:  Diagnosis Date  . ESRD (end stage renal disease) ([facility])   . Gout   . Hyperlipidemia   . Hypertension   . Major  depressive disorder   . SLE (systemic lupus erythematosus) ([facility])   . Type II diabetes mellitus with complication ([facility])   . Vitamin D deficiency       Past Surgical History:  Procedure Laterality Date  . CESAREAN SECTION  1987  . TUBAL LIGATION        Social History:     Social History   Tobacco Use  . Smoking status: Current Every Day Smoker  . Smokeless tobacco: Current User  Substance Use Topics  . Alcohol use: Not Currently     Lives - at home,   Mobility -  Walks by self  Family History :     Family History  Problem Relation Age of Onset  . Diabetes Mother      Home Medications:   Prior to Admission medications   Medication Sig Start Date End Date Taking? Authorizing  Provider  amLODipine (NORVASC) 10 MG tablet Take 10 mg by mouth daily. 12/22/17  Yes [provider]  hydroxychloroquine (PLAQUENIL) 200 MG tablet Take 200 mg by mouth 2 (two) times daily. 12/09/17  Yes [provider]  metoprolol succinate (TOPROL-XL) 25 MG 24 hr tablet Take 25 mg by mouth daily. 12/25/17  Yes [provider]     Allergies:    No Known Allergies   Physical Exam:   Vitals  Blood pressure (!) 172/79, pulse 66, temperature 98.3 F (36.8 C), temperature source Oral, resp. rate 15, height 5\' 5"  (1.651 m), weight 85.7 kg (189 lb), SpO2 97 %.   1. General  lying in bed in NAD,    2. Normal affect and insight, Not Suicidal or Homicidal, Awake Alert, Oriented X 3.  3. No F.N deficits, ALL C.Nerves Intact, Strength 5/5 all 4 extremities, Sensation intact all 4 extremities, Plantars down going.  4. Ears and Eyes appear Normal, Conjunctivae clear, PERRLA. Moist Oral Mucosa.  5. Supple Neck, No JVD, No cervical lymphadenopathy appriciated, No Carotid Bruits.  6. Symmetrical Chest wall movement, Good air movement bilaterally, CTAB.  7. RRR, No Gallops, Rubs or Murmurs, No Parasternal Heave.  8. Positive Bowel Sounds, Abdomen Soft, No tenderness, No  organomegaly appriciated,No rebound -guarding or rigidity.  9.  No Cyanosis, Normal Skin Turgor, No Skin Rash or Bruise.  10. Good muscle tone,  joints appear normal , no effusions, Normal ROM.  11. No Palpable Lymph Nodes in Neck or Axillae   L wrist AVF   Data Review:    CBC Recent Labs  Lab 02/03/18 1537  WBC 4.4  HGB 11.1*  HCT 37.3  PLT 150  MCV 100.0  MCH 29.8  MCHC 29.8*  RDW 14.3  LYMPHSABS 1.1  MONOABS 0.4  EOSABS 0.1  BASOSABS 0.0   ------------------------------------------------------------------------------------------------------------------  Chemistries  Recent Labs  Lab 02/03/18 1537 02/03/18 1904  NA 140  --   K 6.6*  --   CL 112*  --   CO2 17*  --   GLUCOSE 78  --   BUN 104*  --   CREATININE 9.42*  --   CALCIUM 9.0  --   MG  --  2.3   ------------------------------------------------------------------------------------------------------------------ estimated creatinine clearance is 6.7 mL/min (A) (by C-G formula based on SCr of 9.42 mg/dL (H)). ------------------------------------------------------------------------------------------------------------------ No results for input(s): TSH, T4TOTAL, T3FREE, THYROIDAB in the last 72 hours.  Invalid input(s): FREET3  Coagulation profile No results for input(s): INR, PROTIME in the last 168 hours. ------------------------------------------------------------------------------------------------------------------- No results for input(s): DDIMER in the last 72 hours. -------------------------------------------------------------------------------------------------------------------  Cardiac Enzymes No results for input(s): CKMB, TROPONINI, MYOGLOBIN in the last 168 hours.  Invalid input(s): CK ------------------------------------------------------------------------------------------------------------------ No results found for:  BNP   ---------------------------------------------------------------------------------------------------------------  Urinalysis No results found for: COLORURINE, APPEARANCEUR, LABSPEC, PHURINE, GLUCOSEU, HGBUR, BILIRUBINUR, KETONESUR, PROTEINUR, UROBILINOGEN, NITRITE, LEUKOCYTESUR  ----------------------------------------------------------------------------------------------------------------   Imaging Results:    Dg Chest Portable 1 View  Result Date: 02/03/2018 CLINICAL DATA:  Dyspnea and weakness EXAM: PORTABLE CHEST 1 VIEW COMPARISON:  None. FINDINGS: Cardiomegaly with moderate aortic atherosclerosis. Lungs are clear without edema. No effusion or pneumothorax. No acute osseous abnormality. IMPRESSION: Mild cardiomegaly with aortic atherosclerosis. No active pulmonary disease. Electronically Signed   By: Ashley Royalty M.D.   On: 02/03/2018 19:02       Assessment & Plan:    Principal Problem:   Hyperkalemia Active Problems:   ESRD (end stage renal disease) ([facility])   Essential hypertension  Anemia    Hyperkalemia Calcium gluconate Sodium bicarbonate Kayexalate x2 in the ED Bmp pending Check cmp in am  ESRD on HD Nephrology consulted by ED, appreciate input  Anemia Check cbc in am  SLE Cont plaquenil 200mg  po bid  Hypertension Cont Amlodipine 10mg  po qday Cont Metoprolol XL 25mg  po qday   DVT Prophylaxis Heparin -  SCDs   AM Labs Ordered, also please review Full Orders  Family Communication: Admission, patients condition and plan of care including tests being ordered have been discussed with the patient  who indicate understanding and agree with the plan and Code Status.  Code Status  FULL CODE  Likely DC to  home  Condition GUARDED    Consults called: nephrology by ED  Admission status: inpatient  Time spent in minutes : 60   Jani Gravel M.D on 02/03/2018 at 11:55 PM  Between 7am to 7pm - Pager - [phone]  . After 7pm go to [email or web address] -  password [facility]  [facility] - Office  [phone]

## 2018-02-04 ENCOUNTER — Encounter (HOSPITAL_COMMUNITY): Payer: Self-pay | Admitting: *Deleted

## 2018-02-04 ENCOUNTER — Other Ambulatory Visit: Payer: Self-pay

## 2018-02-04 DIAGNOSIS — E875 Hyperkalemia: Secondary | ICD-10-CM | POA: Diagnosis not present

## 2018-02-04 LAB — COMPREHENSIVE METABOLIC PANEL
ALT: 24 U/L (ref 0–44)
ANION GAP: 16 — AB (ref 5–15)
AST: 15 U/L (ref 15–41)
Albumin: 3.5 g/dL (ref 3.5–5.0)
Alkaline Phosphatase: 76 U/L (ref 38–126)
BUN: 98 mg/dL — ABNORMAL HIGH (ref 8–23)
CHLORIDE: 113 mmol/L — AB (ref 98–111)
CO2: 15 mmol/L — AB (ref 22–32)
CREATININE: 8.89 mg/dL — AB (ref 0.44–1.00)
Calcium: 8.8 mg/dL — ABNORMAL LOW (ref 8.9–10.3)
GFR, EST AFRICAN AMERICAN: 5 mL/min — AB (ref >60)
GFR, EST NON AFRICAN AMERICAN: 4 mL/min — AB (ref >60)
Glucose, Bld: 89 mg/dL (ref 70–99)
Potassium: 4.7 mmol/L (ref 3.5–5.1)
SODIUM: 144 mmol/L (ref 135–145)
Total Bilirubin: 0.8 mg/dL (ref 0.3–1.2)
Total Protein: 7.4 g/dL (ref 6.5–8.1)

## 2018-02-04 LAB — CBC
HCT: 35.2 % — ABNORMAL LOW (ref 36.0–46.0)
Hemoglobin: 11 g/dL — ABNORMAL LOW (ref 12.0–15.0)
MCH: 29.9 pg (ref 26.0–34.0)
MCHC: 31.3 g/dL (ref 30.0–36.0)
MCV: 95.7 fL (ref 78.0–100.0)
PLATELETS: 126 10*3/uL — AB (ref 150–400)
RBC: 3.68 MIL/uL — AB (ref 3.87–5.11)
RDW: 14.3 % (ref 11.5–15.5)
WBC: 3.7 10*3/uL — ABNORMAL LOW (ref 4.0–10.5)

## 2018-02-04 LAB — MRSA PCR SCREENING: MRSA BY PCR: NEGATIVE

## 2018-02-04 LAB — HIV ANTIBODY (ROUTINE TESTING W REFLEX): HIV Screen 4th Generation wRfx: NONREACTIVE

## 2018-02-04 MED ORDER — RENA-VITE PO TABS: 1.0000 | ORAL_TABLET | Freq: Every day | ORAL | Status: DC

## 2018-02-04 MED ORDER — HYDROXYCHLOROQUINE SULFATE 200 MG PO TABS
200.0000 mg | ORAL_TABLET | Freq: Two times a day (BID) | ORAL | Status: DC
  Administered 2018-02-04: 200 mg via ORAL
  Filled 2018-02-04: qty 1

## 2018-02-04 MED ORDER — HYDRALAZINE HCL 20 MG/ML IJ SOLN
10.0000 mg | Freq: Four times a day (QID) | INTRAMUSCULAR | Status: DC | PRN
  Administered 2018-02-04 (×2): 10 mg via INTRAVENOUS
  Filled 2018-02-04 (×2): qty 1

## 2018-02-04 MED ORDER — SODIUM CHLORIDE 0.9% FLUSH
3.0000 mL | Freq: Two times a day (BID) | INTRAVENOUS | Status: DC
  Administered 2018-02-04: 3 mL via INTRAVENOUS

## 2018-02-04 MED ORDER — SODIUM CHLORIDE 0.9 % IV SOLN: 100.0000 mL | INTRAVENOUS | Status: DC | PRN

## 2018-02-04 MED ORDER — ACETAMINOPHEN 650 MG RE SUPP: 650.0000 mg | Freq: Four times a day (QID) | RECTAL | Status: DC | PRN

## 2018-02-04 MED ORDER — LIDOCAINE HCL (PF) 1 % IJ SOLN: 5.0000 mL | INTRAMUSCULAR | Status: DC | PRN

## 2018-02-04 MED ORDER — ACETAMINOPHEN 325 MG PO TABS
ORAL_TABLET | ORAL | Status: AC
  Filled 2018-02-04: qty 2

## 2018-02-04 MED ORDER — PENTAFLUOROPROP-TETRAFLUOROETH EX AERO: 1.0000 "application " | INHALATION_SPRAY | CUTANEOUS | Status: DC | PRN

## 2018-02-04 MED ORDER — SODIUM CHLORIDE 0.9% FLUSH: 3.0000 mL | INTRAVENOUS | Status: DC | PRN

## 2018-02-04 MED ORDER — SODIUM CHLORIDE 0.9 % IV SOLN: 250.0000 mL | INTRAVENOUS | Status: DC | PRN

## 2018-02-04 MED ORDER — LIDOCAINE-PRILOCAINE 2.5-2.5 % EX CREA: 1.0000 "application " | TOPICAL_CREAM | CUTANEOUS | Status: DC | PRN

## 2018-02-04 MED ORDER — SEVELAMER CARBONATE 800 MG PO TABS: 800.0000 mg | ORAL_TABLET | Freq: Three times a day (TID) | ORAL | Status: DC

## 2018-02-04 MED ORDER — HEPARIN SODIUM (PORCINE) 5000 UNIT/ML IJ SOLN
5000.0000 [IU] | Freq: Three times a day (TID) | INTRAMUSCULAR | Status: DC
  Administered 2018-02-04: 5000 [IU] via SUBCUTANEOUS
  Filled 2018-02-04: qty 1

## 2018-02-04 MED ORDER — SEVELAMER CARBONATE 800 MG PO TABS: 800.0000 mg | ORAL_TABLET | Freq: Three times a day (TID) | ORAL | 0 refills | Status: DC

## 2018-02-04 MED ORDER — CHLORHEXIDINE GLUCONATE CLOTH 2 % EX PADS: 6.0000 | MEDICATED_PAD | Freq: Every day | CUTANEOUS | Status: DC

## 2018-02-04 MED ORDER — ACETAMINOPHEN 325 MG PO TABS
650.0000 mg | ORAL_TABLET | Freq: Four times a day (QID) | ORAL | Status: DC | PRN
  Administered 2018-02-04: 650 mg via ORAL

## 2018-02-04 MED ORDER — AMLODIPINE BESYLATE 10 MG PO TABS
10.0000 mg | ORAL_TABLET | Freq: Every day | ORAL | Status: DC
  Administered 2018-02-04: 10 mg via ORAL
  Filled 2018-02-04: qty 1

## 2018-02-04 MED ORDER — ALTEPLASE 2 MG IJ SOLR
2.0000 mg | Freq: Once | INTRAMUSCULAR | Status: DC | PRN
Start: 2018-02-04 — End: 2018-02-04

## 2018-02-04 MED ORDER — HEPARIN SODIUM (PORCINE) 1000 UNIT/ML DIALYSIS: 1000.0000 [IU] | INTRAMUSCULAR | Status: DC | PRN

## 2018-02-04 MED ORDER — METOPROLOL SUCCINATE ER 25 MG PO TB24
25.0000 mg | ORAL_TABLET | Freq: Every day | ORAL | Status: DC
  Administered 2018-02-04: 25 mg via ORAL
  Filled 2018-02-04: qty 1

## 2018-02-04 NOTE — Discharge Summary (Signed)
Discharge Summary  Karen Carr UUV:[id number] DOB: 1956/04/04  PCP: Patient, No Pcp Per  Admit date: 02/03/2018 Discharge date: 02/04/2018  Time spent: 103mins  Recommendations for Outpatient Follow-up:  1. F/u with PMD within a week  for hospital discharge follow up, repeat cbc/bmp at follow up. 2. F/u with nephrology Dr Karen Carr 3. Continue outpatient dialysis TTS, next tomorrow on 7/11.  Discharge Diagnoses:  Active Hospital Problems   Diagnosis Date Noted  . Hyperkalemia 02/03/2018  . ESRD (end stage renal disease) ([facility]) 02/03/2018  . Essential hypertension 02/03/2018  . Anemia 02/03/2018    Resolved Hospital Problems  No resolved problems to display.    Discharge Condition: stable  Diet recommendation: dialysis diet  Filed Weights   02/03/18 1501 02/04/18 0950  Weight: 85.7 kg (189 lb) 83.9 kg (184 lb 15.5 oz)    History of present illness: (per admitting MD Dr Maudie Mercury) Karen Carr  is a 62 y.o. female, w hypertension, hyperlipidemia, dm2, SLE, ESRD on HD in [place], apparently has just recently moved from [place], [place] and missed 3 dialysis treatments and therefore HD center would not do her dialysis today. Pt was told to go to ER for evaluation.  Pt was not having any symptoms of uremia.  Last dialysis 1 week ago.   In Ed,  CXR IMPRESSION: Mild cardiomegaly with aortic atherosclerosis. No active pulmonary disease.  Wbc 4.4, Hgb 11.1, Plt 150 Na 140, K 6.6,  Bun 104, Creatinine 9.42 Magnesium 2.3  Pt will be admitted for hyperkalemia.     Hospital Course:  Principal Problem:   Hyperkalemia Active Problems:   ESRD (end stage renal disease) ([facility])   Essential hypertension   Anemia  Hyperkalemia due to missing dialysis --k on presentation was 6.6  -she received iv Calcium gluconate/Sodium bicarbonateKayexalate x2 in the ED -k imporved. -she received HD on 7/10.   ESRD on HD Missing dialysis due to out of town Nephrology consulted by ED, she received  HD today, she will go back on TTS tomorrow,. Nephrology input appreciated  Anemia of chronic disease hgb stable at 11  SLE Stable , Cont plaquenil 200mg  po bid  Hypertension Cont Amlodipine 10mg  po qday Cont Metoprolol XL 25mg  po qday    Procedures:  HD  Consultations:  nephrology  Discharge Exam: BP 132/83 (BP Location: Right Arm)   Pulse 83   Temp 98 F (36.7 C) (Oral)   Resp 18   Ht 5\' 5"  (1.651 m)   Wt 83.9 kg (184 lb 15.5 oz)   SpO2 100%   BMI 30.78 kg/m   General: NAD Cardiovascular: RRR Respiratory: CTABL  Discharge Instructions You were cared for by a hospitalist during your hospital stay. If you have any questions about your discharge medications or the care you received while you were in the hospital after you are discharged, you can call the unit and asked to speak with the hospitalist on call if the hospitalist that took care of you is not available. Once you are discharged, your primary care physician will handle any further medical issues. Please note that NO REFILLS for any discharge medications will be authorized once you are discharged, as it is imperative that you return to your primary care physician (or establish a relationship with a primary care physician if you do not have one) for your aftercare needs so that they can reassess your need for medications and monitor your lab values.  Discharge Instructions    Diet general   Complete by:  As directed    Dialysis diet   Increase activity slowly   Complete by:  As directed      Allergies as of 02/04/2018   No Known Allergies     Medication List    TAKE these medications   amLODipine 10 MG tablet Commonly known as:  NORVASC Take 10 mg by mouth daily.   hydroxychloroquine 200 MG tablet Commonly known as:  PLAQUENIL Take 200 mg by mouth 2 (two) times daily.   metoprolol succinate 25 MG 24 hr tablet Commonly known as:  TOPROL-XL Take 25 mg by mouth daily.   sevelamer carbonate 800  MG tablet Commonly known as:  RENVELA Take 1 tablet (800 mg total) by mouth 3 (three) times daily with meals.      No Known Allergies Follow-up Information    Karen Melena, MD. Schedule an appointment as soon as possible for a visit.   Specialty:  Nephrology Why:  for hospital discharge follow up. Contact information: [place] [address] [phone]        please establish care with pcp of your choice. Follow up in 3 week(s).   Why:  hospital discharge follow up       continue dialysis TTS,next tomorrow on 7/11. Follow up.            The results of significant diagnostics from this hospitalization (including imaging, microbiology, ancillary and laboratory) are listed below for reference.    Significant Diagnostic Studies: Dg Chest Portable 1 View  Result Date: 02/03/2018 CLINICAL DATA:  Dyspnea and weakness EXAM: PORTABLE CHEST 1 VIEW COMPARISON:  None. FINDINGS: Cardiomegaly with moderate aortic atherosclerosis. Lungs are clear without edema. No effusion or pneumothorax. No acute osseous abnormality. IMPRESSION: Mild cardiomegaly with aortic atherosclerosis. No active pulmonary disease. Electronically Signed   By: Ashley Royalty M.D.   On: 02/03/2018 19:02    Microbiology: Recent Results (from the past 240 hour(s))  MRSA PCR Screening     Status: None   Collection Time: 02/04/18 12:31 AM  Result Value Ref Range Status   MRSA by PCR NEGATIVE NEGATIVE Final    Comment:        The GeneXpert MRSA Assay (FDA approved for NASAL specimens only), is one component of a comprehensive MRSA colonization surveillance program. It is not intended to diagnose MRSA infection nor to guide or monitor treatment for MRSA infections. Performed at [facility], [place] [address] [place], [place] [address]      Labs: Basic Metabolic Panel: Recent Labs  Lab 02/03/18 1537 02/03/18 1904 02/04/18 0224  NA 140  --  144  K 6.6*  --  4.7  CL 112*  --  113*  CO2  17*  --  15*  GLUCOSE 78  --  89  BUN 104*  --  98*  CREATININE 9.42*  --  8.89*  CALCIUM 9.0  --  8.8*  MG  --  2.3  --    Liver Function Tests: Recent Labs  Lab 02/04/18 0224  AST 15  ALT 24  ALKPHOS 76  BILITOT 0.8  PROT 7.4  ALBUMIN 3.5   No results for input(s): LIPASE, AMYLASE in the last 168 hours. No results for input(s): AMMONIA in the last 168 hours. CBC: Recent Labs  Lab 02/03/18 1537 02/04/18 0224  WBC 4.4 3.7*  NEUTROABS 2.8  --   HGB 11.1* 11.0*  HCT 37.3 35.2*  MCV 100.0 95.7  PLT 150 126*   Cardiac Enzymes:  No results for input(s): CKTOTAL, CKMB, CKMBINDEX, TROPONINI in the last 168 hours. BNP: BNP (last 3 results) No results for input(s): BNP in the last 8760 hours.  ProBNP (last 3 results) No results for input(s): PROBNP in the last 8760 hours.  CBG: No results for input(s): GLUCAP in the last 168 hours.     Signed:  Florencia Reasons MD, PhD  [facility] 02/04/2018, 2:58 PM

## 2018-02-04 NOTE — Progress Notes (Signed)
Patient discharged to home, AVS reviewed with patient and her daughtr, IV removed and telemetry box was returned. Patient left via wheelchair with staff member

## 2018-02-04 NOTE — Clinical Social Work Note (Signed)
CSW received consult regarding transportation needs. Ms. Karen Carr was provided with transportation resources for the [place] area. Patient reported that her daughter occasionally takes her to HD,  and she also drives at times, but there are occassions when she is weak/tired after HD and does not want to drive. Patient expressed appreciation for resources provided. CSW signing off as patient has discharged home.  Karen Carr, MSW, LCSW Licensed Clinical Social Worker [place] [phone]

## 2018-02-04 NOTE — Procedures (Signed)
   I was present at this dialysis session, have reviewed the session itself and made  appropriate changes Kelly Splinter MD [place] pager [phone]   02/04/2018, 12:37 PM

## 2018-02-04 NOTE — Care Management Note (Signed)
Case Management Note  Patient Details  Name: Roseanne Juenger MRN: [id number] Date of Birth: [birth year]  Subjective/Objective:                    Action/Plan:  Patient currently in hemodialysis.   Spoke with nurse at her outpatient hemodialysis center which is [facility] phone [phone] address: [address], [place] [phone].  Patient has not been discharge from her [facility] . She is scheduled for tomorrow July 11 at 1200 noon. Her nephrologist is DR Otelia Santee. If patient wants to change hemodialysis centers they can assist. Consulted SW for transportation assistance.    Patient has insurance she can call number on insurance card and be provided with list of in network PCP's .  Expected Discharge Date:                  Expected Discharge Plan:  Home/Self Care  In-House Referral:  Clinical Social Work  Discharge planning Services  CM Consult  Post Acute Care Choice:  NA Choice offered to:     DME Arranged:  N/A DME Agency:  NA  HH Arranged:  NA HH Agency:  NA  Status of Service:  In process, will continue to follow  If discussed at Long Length of Stay Meetings, dates discussed:    Additional Comments:  Marilu Favre, RN 02/04/2018, 12:06 PM

## 2018-02-04 NOTE — Consult Note (Addendum)
[facility] Renal Consultation Note    Indication for Consultation:  Management of ESRD/hemodialysis; anemia, hypertension/volume and secondary hyperparathyroidism  HPI: Karen Carr is a 62 y.o. female with ESRD on HD, lupus, DM Type 2, HTN, HLD.   Dialyzes at [facility] Her last outpatient dialysis was on 01/24/18. She went on an unscheduled trip out of town due to a "family emergency" and did not notify her outpatient unit. She presented to her outpatient unit yesterday and was instructed to go to the ED for evaluation per their policy.   In the ED found to be hyperkalemic with K 6.6 and EKG showing peaked T waves. Received Kayexalate, calcium gluconate, bicarb in ED and admitted under observation status. Labs this am K 4.7, CO2 15, BUN 98, Hgb 11.0. Asked to see for dialysis needs. Unfortunately her outpatient unit will not be able to accomadate her today as all chairs are full.   She feels "fine" this morning, has no complaints. Denies CP, SOB, N,V,D. She has been on dialysis for 5 years. Moved to [place] in May 2019 from [place]. Was told her kidney disease was caused by lupus.   Past Medical History:  Diagnosis Date  . ESRD (end stage renal disease) ([place])   . Gout   . Hyperlipidemia   . Hypertension   . Major depressive disorder   . SLE (systemic lupus erythematosus) ([place])   . Type II diabetes mellitus with complication ([place])   . Vitamin D deficiency    Past Surgical History:  Procedure Laterality Date  . CESAREAN SECTION  1987  . TUBAL LIGATION     Family History  Problem Relation Age of Onset  . Diabetes Mother    Social History:  reports that she has been smoking.  She uses smokeless tobacco. She reports that she drank alcohol. She reports that she has current or past drug history. No Known Allergies Prior to Admission medications   Medication Sig Start Date End Date Taking? Authorizing Provider  amLODipine (NORVASC) 10 MG  tablet Take 10 mg by mouth daily. 12/22/17  Yes [provider]  hydroxychloroquine (PLAQUENIL) 200 MG tablet Take 200 mg by mouth 2 (two) times daily. 12/09/17  Yes [provider]  metoprolol succinate (TOPROL-XL) 25 MG 24 hr tablet Take 25 mg by mouth daily. 12/25/17  Yes [provider]   Current Facility-Administered Medications  Medication Dose Route Frequency Provider Last Rate Last Dose  . 0.9 %  sodium chloride infusion  250 mL Intravenous PRN Jani Gravel, MD      . acetaminophen (TYLENOL) tablet 650 mg  650 mg Oral Q6H PRN Jani Gravel, MD       Or  . acetaminophen (TYLENOL) suppository 650 mg  650 mg Rectal Q6H PRN Jani Gravel, MD      . amLODipine (NORVASC) tablet 10 mg  10 mg Oral Daily Jani Gravel, MD      . Chlorhexidine Gluconate Cloth 2 % PADS 6 each  6 each Topical Q0600 Lynnda Child, PA-C      . heparin injection 5,000 Units  5,000 Units Subcutaneous Q8H Jani Gravel, MD   5,000 Units at 02/04/18 1517  . hydrALAZINE (APRESOLINE) injection 10 mg  10 mg Intravenous Q6H PRN Jani Gravel, MD   10 mg at 02/04/18 6160  . hydroxychloroquine (PLAQUENIL) tablet 200 mg  200 mg Oral BID Jani Gravel, MD      . metoprolol succinate (TOPROL-XL) 24 hr tablet 25 mg  25 mg Oral Daily Jani Gravel, MD      . sodium chloride flush (NS) 0.9 % injection 3 mL  3 mL Intravenous Q12H Jani Gravel, MD   3 mL at 02/04/18 0103  . sodium chloride flush (NS) 0.9 % injection 3 mL  3 mL Intravenous PRN Jani Gravel, MD      . sodium polystyrene (KAYEXALATE) 15 GM/60ML suspension 60 g  60 g Oral Once Duffy Bruce, MD        ROS: As per HPI otherwise negative.  Physical Exam: Vitals:   02/03/18 2300 02/03/18 2315 02/04/18 0024 02/04/18 0552  BP: (!) 172/79 (!) 151/82 (!) 209/80 (!) 175/81  Pulse: 66 66 71 69  Resp: 15 13 20 18   Temp:   98.4 F (36.9 C)   TempSrc:   Oral   SpO2: 97% 98% 100% 97%  Weight:      Height:         General: WDWN female NAD Head: NCAT sclera not  icteric MMM Neck: Supple. No JVD No masses Lungs: CTA bilaterally without wheezes, rales, or rhonchi. Breathing is unlabored. Heart: RRR 2/6 systolic murmur  Abdomen: soft NT + BS Lower extremities:without edema or ischemic changes, no open wounds  Neuro: A & O  X 3. Moves all extremities spontaneously. Psych:  Responds to questions appropriately with a normal affect. Dialysis Access: LUE AVF +bruit   Labs: Basic Metabolic Panel: Recent Labs  Lab 02/03/18 1537 02/04/18 0224  NA 140 144  K 6.6* 4.7  CL 112* 113*  CO2 17* 15*  GLUCOSE 78 89  BUN 104* 98*  CREATININE 9.42* 8.89*  CALCIUM 9.0 8.8*   Liver Function Tests: Recent Labs  Lab 02/04/18 0224  AST 15  ALT 24  ALKPHOS 76  BILITOT 0.8  PROT 7.4  ALBUMIN 3.5   No results for input(s): LIPASE, AMYLASE in the last 168 hours. No results for input(s): AMMONIA in the last 168 hours. CBC: Recent Labs  Lab 02/03/18 1537 02/04/18 0224  WBC 4.4 3.7*  NEUTROABS 2.8  --   HGB 11.1* 11.0*  HCT 37.3 35.2*  MCV 100.0 95.7  PLT 150 126*   Cardiac Enzymes: No results for input(s): CKTOTAL, CKMB, CKMBINDEX, TROPONINI in the last 168 hours. CBG: No results for input(s): GLUCAP in the last 168 hours. Iron Studies: No results for input(s): IRON, TIBC, TRANSFERRIN, FERRITIN in the last 72 hours. Studies/Results: Dg Chest Portable 1 View  Result Date: 02/03/2018 CLINICAL DATA:  Dyspnea and weakness EXAM: PORTABLE CHEST 1 VIEW COMPARISON:  None. FINDINGS: Cardiomegaly with moderate aortic atherosclerosis. Lungs are clear without edema. No effusion or pneumothorax. No acute osseous abnormality. IMPRESSION: Mild cardiomegaly with aortic atherosclerosis. No active pulmonary disease. Electronically Signed   By: Ashley Royalty M.D.   On: 02/03/2018 19:02    Dialysis Orders:  [place] HP 3h 180NRe 450/800 EDW 85kg 2K/2.25Ca L AVF No heparin Hectorol 28mcg IV TIW Mircera 72mcg IV q 4 weeks   Assessment/Plan: 1. Hyperkalemia 2/2 to  missed dialysis - Last outpatient HD was 01/24/18. Temporizing meds dosed in ED. HD today - decreased BFR. Monitor for dysequilibrium syndrome. Should be ok for discharge after HD.  2. ESRD -  Usually TTS. HD today off schedule for missed treatments - then back to regular schedule tomorrow at outpatient center - patient aware 3. Hypertension/volume  - BP elevated/Below her dry weight here. Challenge EDW today. Continue to titrate down as outpatient. 4. Anemia  - Hgb stable. On  ESA 5. Metabolic bone disease -  Continue Hectorol/Renvela binder  6. Lupus - on Plaquenil   Lynnda Child PA-C [place] Pager [phone] 02/04/2018, 10:15 AM   Pt seen, examined and agree w A/P as above.  Kelly Splinter MD [employer] pager [phone]   02/04/2018, 12:35 PM

## 2018-02-05 DIAGNOSIS — D509 Iron deficiency anemia, unspecified: Secondary | ICD-10-CM | POA: Diagnosis not present

## 2018-02-05 DIAGNOSIS — N186 End stage renal disease: Secondary | ICD-10-CM | POA: Diagnosis not present

## 2018-02-05 DIAGNOSIS — N2581 Secondary hyperparathyroidism of renal origin: Secondary | ICD-10-CM | POA: Diagnosis not present

## 2018-02-05 DIAGNOSIS — D631 Anemia in chronic kidney disease: Secondary | ICD-10-CM | POA: Diagnosis not present

## 2018-02-07 DIAGNOSIS — D631 Anemia in chronic kidney disease: Secondary | ICD-10-CM | POA: Diagnosis not present

## 2018-02-07 DIAGNOSIS — N186 End stage renal disease: Secondary | ICD-10-CM | POA: Diagnosis not present

## 2018-02-07 DIAGNOSIS — D509 Iron deficiency anemia, unspecified: Secondary | ICD-10-CM | POA: Diagnosis not present

## 2018-02-07 DIAGNOSIS — N2581 Secondary hyperparathyroidism of renal origin: Secondary | ICD-10-CM | POA: Diagnosis not present

## 2018-02-10 DIAGNOSIS — D631 Anemia in chronic kidney disease: Secondary | ICD-10-CM | POA: Diagnosis not present

## 2018-02-10 DIAGNOSIS — N186 End stage renal disease: Secondary | ICD-10-CM | POA: Diagnosis not present

## 2018-02-10 DIAGNOSIS — N2581 Secondary hyperparathyroidism of renal origin: Secondary | ICD-10-CM | POA: Diagnosis not present

## 2018-02-10 DIAGNOSIS — D509 Iron deficiency anemia, unspecified: Secondary | ICD-10-CM | POA: Diagnosis not present

## 2018-02-12 DIAGNOSIS — N2581 Secondary hyperparathyroidism of renal origin: Secondary | ICD-10-CM | POA: Diagnosis not present

## 2018-02-12 DIAGNOSIS — D631 Anemia in chronic kidney disease: Secondary | ICD-10-CM | POA: Diagnosis not present

## 2018-02-12 DIAGNOSIS — N186 End stage renal disease: Secondary | ICD-10-CM | POA: Diagnosis not present

## 2018-02-12 DIAGNOSIS — D509 Iron deficiency anemia, unspecified: Secondary | ICD-10-CM | POA: Diagnosis not present

## 2018-02-14 DIAGNOSIS — D509 Iron deficiency anemia, unspecified: Secondary | ICD-10-CM | POA: Diagnosis not present

## 2018-02-14 DIAGNOSIS — D631 Anemia in chronic kidney disease: Secondary | ICD-10-CM | POA: Diagnosis not present

## 2018-02-14 DIAGNOSIS — N2581 Secondary hyperparathyroidism of renal origin: Secondary | ICD-10-CM | POA: Diagnosis not present

## 2018-02-14 DIAGNOSIS — N186 End stage renal disease: Secondary | ICD-10-CM | POA: Diagnosis not present

## 2018-02-19 DIAGNOSIS — N2581 Secondary hyperparathyroidism of renal origin: Secondary | ICD-10-CM | POA: Diagnosis not present

## 2018-02-19 DIAGNOSIS — D509 Iron deficiency anemia, unspecified: Secondary | ICD-10-CM | POA: Diagnosis not present

## 2018-02-19 DIAGNOSIS — N186 End stage renal disease: Secondary | ICD-10-CM | POA: Diagnosis not present

## 2018-02-19 DIAGNOSIS — D631 Anemia in chronic kidney disease: Secondary | ICD-10-CM | POA: Diagnosis not present

## 2018-02-21 DIAGNOSIS — N2581 Secondary hyperparathyroidism of renal origin: Secondary | ICD-10-CM | POA: Diagnosis not present

## 2018-02-21 DIAGNOSIS — D631 Anemia in chronic kidney disease: Secondary | ICD-10-CM | POA: Diagnosis not present

## 2018-02-21 DIAGNOSIS — D509 Iron deficiency anemia, unspecified: Secondary | ICD-10-CM | POA: Diagnosis not present

## 2018-02-21 DIAGNOSIS — N186 End stage renal disease: Secondary | ICD-10-CM | POA: Diagnosis not present

## 2018-02-24 DIAGNOSIS — D509 Iron deficiency anemia, unspecified: Secondary | ICD-10-CM | POA: Diagnosis not present

## 2018-02-24 DIAGNOSIS — N186 End stage renal disease: Secondary | ICD-10-CM | POA: Diagnosis not present

## 2018-02-24 DIAGNOSIS — D631 Anemia in chronic kidney disease: Secondary | ICD-10-CM | POA: Diagnosis not present

## 2018-02-24 DIAGNOSIS — N2581 Secondary hyperparathyroidism of renal origin: Secondary | ICD-10-CM | POA: Diagnosis not present

## 2018-02-25 DIAGNOSIS — Z992 Dependence on renal dialysis: Secondary | ICD-10-CM | POA: Diagnosis not present

## 2018-02-25 DIAGNOSIS — N186 End stage renal disease: Secondary | ICD-10-CM | POA: Diagnosis not present

## 2018-02-25 DIAGNOSIS — I129 Hypertensive chronic kidney disease with stage 1 through stage 4 chronic kidney disease, or unspecified chronic kidney disease: Secondary | ICD-10-CM | POA: Diagnosis not present

## 2018-02-26 DIAGNOSIS — N186 End stage renal disease: Secondary | ICD-10-CM | POA: Diagnosis not present

## 2018-02-26 DIAGNOSIS — D509 Iron deficiency anemia, unspecified: Secondary | ICD-10-CM | POA: Diagnosis not present

## 2018-02-26 DIAGNOSIS — N2581 Secondary hyperparathyroidism of renal origin: Secondary | ICD-10-CM | POA: Diagnosis not present

## 2018-02-26 DIAGNOSIS — D631 Anemia in chronic kidney disease: Secondary | ICD-10-CM | POA: Diagnosis not present

## 2018-02-28 DIAGNOSIS — D631 Anemia in chronic kidney disease: Secondary | ICD-10-CM | POA: Diagnosis not present

## 2018-02-28 DIAGNOSIS — N186 End stage renal disease: Secondary | ICD-10-CM | POA: Diagnosis not present

## 2018-02-28 DIAGNOSIS — N2581 Secondary hyperparathyroidism of renal origin: Secondary | ICD-10-CM | POA: Diagnosis not present

## 2018-02-28 DIAGNOSIS — D509 Iron deficiency anemia, unspecified: Secondary | ICD-10-CM | POA: Diagnosis not present

## 2018-03-03 DIAGNOSIS — N186 End stage renal disease: Secondary | ICD-10-CM | POA: Diagnosis not present

## 2018-03-03 DIAGNOSIS — D631 Anemia in chronic kidney disease: Secondary | ICD-10-CM | POA: Diagnosis not present

## 2018-03-03 DIAGNOSIS — N2581 Secondary hyperparathyroidism of renal origin: Secondary | ICD-10-CM | POA: Diagnosis not present

## 2018-03-03 DIAGNOSIS — D509 Iron deficiency anemia, unspecified: Secondary | ICD-10-CM | POA: Diagnosis not present

## 2018-03-05 DIAGNOSIS — D509 Iron deficiency anemia, unspecified: Secondary | ICD-10-CM | POA: Diagnosis not present

## 2018-03-05 DIAGNOSIS — N2581 Secondary hyperparathyroidism of renal origin: Secondary | ICD-10-CM | POA: Diagnosis not present

## 2018-03-05 DIAGNOSIS — D631 Anemia in chronic kidney disease: Secondary | ICD-10-CM | POA: Diagnosis not present

## 2018-03-05 DIAGNOSIS — N186 End stage renal disease: Secondary | ICD-10-CM | POA: Diagnosis not present

## 2018-03-10 DIAGNOSIS — N186 End stage renal disease: Secondary | ICD-10-CM | POA: Diagnosis not present

## 2018-03-10 DIAGNOSIS — N2581 Secondary hyperparathyroidism of renal origin: Secondary | ICD-10-CM | POA: Diagnosis not present

## 2018-03-10 DIAGNOSIS — D631 Anemia in chronic kidney disease: Secondary | ICD-10-CM | POA: Diagnosis not present

## 2018-03-10 DIAGNOSIS — D509 Iron deficiency anemia, unspecified: Secondary | ICD-10-CM | POA: Diagnosis not present

## 2018-03-12 DIAGNOSIS — N186 End stage renal disease: Secondary | ICD-10-CM | POA: Diagnosis not present

## 2018-03-12 DIAGNOSIS — D631 Anemia in chronic kidney disease: Secondary | ICD-10-CM | POA: Diagnosis not present

## 2018-03-12 DIAGNOSIS — D509 Iron deficiency anemia, unspecified: Secondary | ICD-10-CM | POA: Diagnosis not present

## 2018-03-12 DIAGNOSIS — N2581 Secondary hyperparathyroidism of renal origin: Secondary | ICD-10-CM | POA: Diagnosis not present

## 2018-03-14 DIAGNOSIS — D509 Iron deficiency anemia, unspecified: Secondary | ICD-10-CM | POA: Diagnosis not present

## 2018-03-14 DIAGNOSIS — D631 Anemia in chronic kidney disease: Secondary | ICD-10-CM | POA: Diagnosis not present

## 2018-03-14 DIAGNOSIS — N186 End stage renal disease: Secondary | ICD-10-CM | POA: Diagnosis not present

## 2018-03-14 DIAGNOSIS — N2581 Secondary hyperparathyroidism of renal origin: Secondary | ICD-10-CM | POA: Diagnosis not present

## 2018-03-17 DIAGNOSIS — D631 Anemia in chronic kidney disease: Secondary | ICD-10-CM | POA: Diagnosis not present

## 2018-03-17 DIAGNOSIS — N2581 Secondary hyperparathyroidism of renal origin: Secondary | ICD-10-CM | POA: Diagnosis not present

## 2018-03-17 DIAGNOSIS — D509 Iron deficiency anemia, unspecified: Secondary | ICD-10-CM | POA: Diagnosis not present

## 2018-03-17 DIAGNOSIS — N186 End stage renal disease: Secondary | ICD-10-CM | POA: Diagnosis not present

## 2018-03-19 DIAGNOSIS — D631 Anemia in chronic kidney disease: Secondary | ICD-10-CM | POA: Diagnosis not present

## 2018-03-19 DIAGNOSIS — N2581 Secondary hyperparathyroidism of renal origin: Secondary | ICD-10-CM | POA: Diagnosis not present

## 2018-03-19 DIAGNOSIS — N186 End stage renal disease: Secondary | ICD-10-CM | POA: Diagnosis not present

## 2018-03-19 DIAGNOSIS — D509 Iron deficiency anemia, unspecified: Secondary | ICD-10-CM | POA: Diagnosis not present

## 2018-03-24 DIAGNOSIS — N2581 Secondary hyperparathyroidism of renal origin: Secondary | ICD-10-CM | POA: Diagnosis not present

## 2018-03-24 DIAGNOSIS — D509 Iron deficiency anemia, unspecified: Secondary | ICD-10-CM | POA: Diagnosis not present

## 2018-03-24 DIAGNOSIS — N186 End stage renal disease: Secondary | ICD-10-CM | POA: Diagnosis not present

## 2018-03-24 DIAGNOSIS — D631 Anemia in chronic kidney disease: Secondary | ICD-10-CM | POA: Diagnosis not present

## 2018-03-26 DIAGNOSIS — N186 End stage renal disease: Secondary | ICD-10-CM | POA: Diagnosis not present

## 2018-03-26 DIAGNOSIS — D509 Iron deficiency anemia, unspecified: Secondary | ICD-10-CM | POA: Diagnosis not present

## 2018-03-26 DIAGNOSIS — D631 Anemia in chronic kidney disease: Secondary | ICD-10-CM | POA: Diagnosis not present

## 2018-03-26 DIAGNOSIS — N2581 Secondary hyperparathyroidism of renal origin: Secondary | ICD-10-CM | POA: Diagnosis not present

## 2018-03-28 DIAGNOSIS — N186 End stage renal disease: Secondary | ICD-10-CM | POA: Diagnosis not present

## 2018-03-28 DIAGNOSIS — I129 Hypertensive chronic kidney disease with stage 1 through stage 4 chronic kidney disease, or unspecified chronic kidney disease: Secondary | ICD-10-CM | POA: Diagnosis not present

## 2018-03-28 DIAGNOSIS — D509 Iron deficiency anemia, unspecified: Secondary | ICD-10-CM | POA: Diagnosis not present

## 2018-03-28 DIAGNOSIS — D631 Anemia in chronic kidney disease: Secondary | ICD-10-CM | POA: Diagnosis not present

## 2018-03-28 DIAGNOSIS — Z992 Dependence on renal dialysis: Secondary | ICD-10-CM | POA: Diagnosis not present

## 2018-03-28 DIAGNOSIS — N2581 Secondary hyperparathyroidism of renal origin: Secondary | ICD-10-CM | POA: Diagnosis not present

## 2018-03-31 DIAGNOSIS — N2581 Secondary hyperparathyroidism of renal origin: Secondary | ICD-10-CM | POA: Diagnosis not present

## 2018-03-31 DIAGNOSIS — D631 Anemia in chronic kidney disease: Secondary | ICD-10-CM | POA: Diagnosis not present

## 2018-03-31 DIAGNOSIS — N186 End stage renal disease: Secondary | ICD-10-CM | POA: Diagnosis not present

## 2018-03-31 DIAGNOSIS — D509 Iron deficiency anemia, unspecified: Secondary | ICD-10-CM | POA: Diagnosis not present

## 2018-04-02 ENCOUNTER — Ambulatory Visit: Payer: MEDICARE | Admitting: Podiatry

## 2018-04-02 ENCOUNTER — Telehealth: Payer: Self-pay

## 2018-04-02 DIAGNOSIS — N186 End stage renal disease: Secondary | ICD-10-CM | POA: Diagnosis not present

## 2018-04-02 DIAGNOSIS — D631 Anemia in chronic kidney disease: Secondary | ICD-10-CM | POA: Diagnosis not present

## 2018-04-02 DIAGNOSIS — D509 Iron deficiency anemia, unspecified: Secondary | ICD-10-CM | POA: Diagnosis not present

## 2018-04-02 DIAGNOSIS — N2581 Secondary hyperparathyroidism of renal origin: Secondary | ICD-10-CM | POA: Diagnosis not present

## 2018-04-02 NOTE — Telephone Encounter (Signed)
Copied from [place]. Topic: Appointment Scheduling - New Patient >> Apr 02, 2018 12:21 PM Conception Chancy, NT wrote: New patient has been scheduled for your office. Provider: Nani Ravens Date of Appointment: 04/13/18  Route to department's PEC pool.

## 2018-04-04 DIAGNOSIS — D631 Anemia in chronic kidney disease: Secondary | ICD-10-CM | POA: Diagnosis not present

## 2018-04-04 DIAGNOSIS — D509 Iron deficiency anemia, unspecified: Secondary | ICD-10-CM | POA: Diagnosis not present

## 2018-04-04 DIAGNOSIS — N186 End stage renal disease: Secondary | ICD-10-CM | POA: Diagnosis not present

## 2018-04-04 DIAGNOSIS — N2581 Secondary hyperparathyroidism of renal origin: Secondary | ICD-10-CM | POA: Diagnosis not present

## 2018-04-07 DIAGNOSIS — D631 Anemia in chronic kidney disease: Secondary | ICD-10-CM | POA: Diagnosis not present

## 2018-04-07 DIAGNOSIS — N186 End stage renal disease: Secondary | ICD-10-CM | POA: Diagnosis not present

## 2018-04-07 DIAGNOSIS — N2581 Secondary hyperparathyroidism of renal origin: Secondary | ICD-10-CM | POA: Diagnosis not present

## 2018-04-07 DIAGNOSIS — D509 Iron deficiency anemia, unspecified: Secondary | ICD-10-CM | POA: Diagnosis not present

## 2018-04-11 DIAGNOSIS — D631 Anemia in chronic kidney disease: Secondary | ICD-10-CM | POA: Diagnosis not present

## 2018-04-11 DIAGNOSIS — D509 Iron deficiency anemia, unspecified: Secondary | ICD-10-CM | POA: Diagnosis not present

## 2018-04-11 DIAGNOSIS — N186 End stage renal disease: Secondary | ICD-10-CM | POA: Diagnosis not present

## 2018-04-11 DIAGNOSIS — N2581 Secondary hyperparathyroidism of renal origin: Secondary | ICD-10-CM | POA: Diagnosis not present

## 2018-04-13 ENCOUNTER — Ambulatory Visit (INDEPENDENT_AMBULATORY_CARE_PROVIDER_SITE_OTHER): Payer: MEDICARE | Admitting: Family Medicine

## 2018-04-13 ENCOUNTER — Encounter: Payer: Self-pay | Admitting: Family Medicine

## 2018-04-13 VITALS — BP 155/72 | HR 100 | Temp 99.6°F | Ht 66.0 in | Wt 189.5 lb

## 2018-04-13 DIAGNOSIS — I10 Essential (primary) hypertension: Secondary | ICD-10-CM

## 2018-04-13 DIAGNOSIS — Z79899 Other long term (current) drug therapy: Secondary | ICD-10-CM | POA: Insufficient documentation

## 2018-04-13 DIAGNOSIS — M329 Systemic lupus erythematosus, unspecified: Secondary | ICD-10-CM

## 2018-04-13 MED ORDER — AMLODIPINE BESYLATE 10 MG PO TABS: 10.0000 mg | ORAL_TABLET | Freq: Every day | ORAL | 5 refills | Status: DC

## 2018-04-13 MED ORDER — METOPROLOL SUCCINATE ER 25 MG PO TB24: 25.0000 mg | ORAL_TABLET | Freq: Every day | ORAL | 5 refills | Status: DC

## 2018-04-13 MED ORDER — HYDROXYCHLOROQUINE SULFATE 200 MG PO TABS: 200.0000 mg | ORAL_TABLET | Freq: Two times a day (BID) | ORAL | 5 refills | Status: DC

## 2018-04-13 NOTE — Progress Notes (Signed)
Chief Complaint  Patient presents with  . New Patient (Initial Visit)       New Patient Visit SUBJECTIVE: HPI: Karen Carr is an 62 y.o.female who is being seen for establishing care.  The patient was previously seen at her nephrologist's office.  Hypertension Patient presents for hypertension follow up. She does monitor home blood pressures. She is compliant with medications. Patient has these side effects of medication: none She is adhering to a healthy diet overall. Exercise: stays active, some walking  Hx of ESRD following with nephro.  Hx of SLE currently on Plaquenil. Has not had an eye exam in past year. Does not follow with rheum. Doing well on current med and dose.  No Known Allergies  Past Medical History:  Diagnosis Date  . ESRD (end stage renal disease) ([place])   . Gout   . Hyperlipidemia   . Hypertension   . Major depressive disorder   . SLE (systemic lupus erythematosus) ([place])   . Vitamin D deficiency    Past Surgical History:  Procedure Laterality Date  . CESAREAN SECTION  1987  . TUBAL LIGATION     Family History  Problem Relation Age of Onset  . Diabetes Mother   . Heart disease Mother   . Tuberculosis Father    No Known Allergies  Current Outpatient Medications:  .  amLODipine (NORVASC) 10 MG tablet, Take 1 tablet (10 mg total) by mouth daily., Disp: 30 tablet, Rfl: 5 .  hydroxychloroquine (PLAQUENIL) 200 MG tablet, Take 1 tablet (200 mg total) by mouth 2 (two) times daily., Disp: 30 tablet, Rfl: 5 .  metoprolol succinate (TOPROL-XL) 25 MG 24 hr tablet, Take 1 tablet (25 mg total) by mouth daily., Disp: 30 tablet, Rfl: 5 .  sevelamer carbonate (RENVELA) 800 MG tablet, Take 1 tablet (800 mg total) by mouth 3 (three) times daily with meals., Disp: 90 tablet, Rfl: 0  ROS Cardiovascular: Denies chest pain  Respiratory: Denies dyspnea   OBJECTIVE: BP (!) 155/72 (BP Location: Left Arm, Patient Position: Sitting, Cuff Size: Large)   Pulse 100    Temp 99.6 F (37.6 C) (Oral)   Ht 5\' 6"  (1.676 m)   Wt 189 lb 8 oz (86 kg)   SpO2 97%   BMI 30.59 kg/m   Constitutional: -  VS reviewed -  Well developed, well nourished, appears stated age -  No apparent distress  Psychiatric: -  Oriented to person, place, and time -  Memory intact -  Affect and mood normal -  Fluent conversation, good eye contact -  Judgment and insight age appropriate  Eye: -  Conjunctivae clear, no discharge -  Pupils symmetric, round, reactive to light  ENMT: -  MMM    Pharynx moist, no exudate, no erythema  Neck: -  No gross swelling, no palpable masses -  Thyroid midline, not enlarged, mobile, no palpable masses  Cardiovascular: -  RRR -  2/6 SEM heard loudest at tricuspid listening post -  No LE edema  Respiratory: -  Normal respiratory effort, no accessory muscle use, no retraction -  Breath sounds equal, no wheezes, no ronchi, no crackles  Musculoskeletal: -  No clubbing, no cyanosis -  Gait normal  Skin: -  No significant lesion on inspection -  Warm and dry to palpation   ASSESSMENT/PLAN: Essential hypertension - Plan: metoprolol succinate (TOPROL-XL) 25 MG 24 hr tablet, amLODipine (NORVASC) 10 MG tablet  Systemic lupus erythematosus, unspecified SLE type, unspecified organ involvement status ([place]) -  Plan: hydroxychloroquine (PLAQUENIL) 200 MG tablet, Ambulatory referral to Rheumatology  Long-term use of Plaquenil - Plan: Ambulatory referral to Ophthalmology  Patient instructed to sign release of records form from her previous PCP. Pt has been off of meds for 2 days. Will refill and if high readings at home, will adjust. Counseled on diet and exercise. Refer to rheum and ophtho for above reasons. Patient should return in 6 mo for med check or prn. The patient voiced understanding and agreement to the plan.   [place], DO 04/13/18  2:28 PM

## 2018-04-13 NOTE — Patient Instructions (Addendum)
If you do not hear anything about your referrals in the next 1-2 weeks, call our office and ask for an update.  Keep the diet clean.  Stay active.  Consider an eye cream ([place]) to help prevent further wrinkling (nothing will completely stop this).  Let us know if you need anything.

## 2018-04-13 NOTE — Progress Notes (Signed)
Pre visit review using our clinic review tool, if applicable. No additional management support is needed unless otherwise documented below in the visit note. 

## 2018-04-14 DIAGNOSIS — N186 End stage renal disease: Secondary | ICD-10-CM | POA: Diagnosis not present

## 2018-04-14 DIAGNOSIS — D509 Iron deficiency anemia, unspecified: Secondary | ICD-10-CM | POA: Diagnosis not present

## 2018-04-14 DIAGNOSIS — N2581 Secondary hyperparathyroidism of renal origin: Secondary | ICD-10-CM | POA: Diagnosis not present

## 2018-04-14 DIAGNOSIS — D631 Anemia in chronic kidney disease: Secondary | ICD-10-CM | POA: Diagnosis not present

## 2018-04-15 ENCOUNTER — Other Ambulatory Visit: Payer: Self-pay | Admitting: Podiatry

## 2018-04-15 ENCOUNTER — Encounter: Payer: Self-pay | Admitting: Podiatry

## 2018-04-15 ENCOUNTER — Ambulatory Visit (INDEPENDENT_AMBULATORY_CARE_PROVIDER_SITE_OTHER): Payer: MEDICARE

## 2018-04-15 ENCOUNTER — Ambulatory Visit (INDEPENDENT_AMBULATORY_CARE_PROVIDER_SITE_OTHER): Payer: MEDICARE | Admitting: Podiatry

## 2018-04-15 VITALS — BP 152/76 | HR 76

## 2018-04-15 DIAGNOSIS — M779 Enthesopathy, unspecified: Secondary | ICD-10-CM

## 2018-04-15 DIAGNOSIS — M79671 Pain in right foot: Secondary | ICD-10-CM

## 2018-04-15 DIAGNOSIS — Q828 Other specified congenital malformations of skin: Secondary | ICD-10-CM | POA: Diagnosis not present

## 2018-04-15 DIAGNOSIS — M79672 Pain in left foot: Secondary | ICD-10-CM | POA: Diagnosis not present

## 2018-04-16 DIAGNOSIS — N186 End stage renal disease: Secondary | ICD-10-CM | POA: Diagnosis not present

## 2018-04-16 DIAGNOSIS — D631 Anemia in chronic kidney disease: Secondary | ICD-10-CM | POA: Diagnosis not present

## 2018-04-16 DIAGNOSIS — N2581 Secondary hyperparathyroidism of renal origin: Secondary | ICD-10-CM | POA: Diagnosis not present

## 2018-04-16 DIAGNOSIS — D509 Iron deficiency anemia, unspecified: Secondary | ICD-10-CM | POA: Diagnosis not present

## 2018-04-17 NOTE — Progress Notes (Signed)
   Subjective: 62 year old female presenting today as a new patient with a chief complaint of painful callus lesions noted to bilateral feet that have been present for several years. She describes the pain as shooting pain that increases with walking. She has not done anything for treatment. Patient is here for further evaluation and treatment.   Past Medical History:  Diagnosis Date  . ESRD (end stage renal disease) ([place])   . Gout   . Hyperlipidemia   . Hypertension   . Major depressive disorder   . SLE (systemic lupus erythematosus) ([place])   . Vitamin D deficiency      Objective:  Physical Exam General: Alert and oriented x3 in no acute distress  Dermatology: Hyperkeratotic lesions present on the bilateral sub-fifth MPJs. Pain on palpation with a central nucleated core noted. Skin is warm, dry and supple bilateral lower extremities. Negative for open lesions or macerations.  Vascular: Palpable pedal pulses bilaterally. No edema or erythema noted. Capillary refill within normal limits.  Neurological: Epicritic and protective threshold grossly intact bilaterally.   Musculoskeletal Exam: Pain on palpation at the keratotic lesion noted. Range of motion within normal limits bilateral. Muscle strength 5/5 in all groups bilateral.  Assessment: 1. Porokeratosis sub-fifth MPJs bilateral    Plan of Care:  1. Patient evaluated 2. Excisional debridement of keratoic lesion using a chisel blade was performed without incident.  3. Salinocaine applied and light dressing placed. 4. Patient is to return to the clinic PRN.   Edrick Kins, DPM [facility]  Dr. Edrick Kins, [place]                                        [place], [place] [address]                Office [phone]  Fax [phone]

## 2018-04-18 DIAGNOSIS — D631 Anemia in chronic kidney disease: Secondary | ICD-10-CM | POA: Diagnosis not present

## 2018-04-18 DIAGNOSIS — D509 Iron deficiency anemia, unspecified: Secondary | ICD-10-CM | POA: Diagnosis not present

## 2018-04-18 DIAGNOSIS — N186 End stage renal disease: Secondary | ICD-10-CM | POA: Diagnosis not present

## 2018-04-18 DIAGNOSIS — N2581 Secondary hyperparathyroidism of renal origin: Secondary | ICD-10-CM | POA: Diagnosis not present

## 2018-04-21 DIAGNOSIS — N186 End stage renal disease: Secondary | ICD-10-CM | POA: Diagnosis not present

## 2018-04-21 DIAGNOSIS — N2581 Secondary hyperparathyroidism of renal origin: Secondary | ICD-10-CM | POA: Diagnosis not present

## 2018-04-21 DIAGNOSIS — D631 Anemia in chronic kidney disease: Secondary | ICD-10-CM | POA: Diagnosis not present

## 2018-04-21 DIAGNOSIS — D509 Iron deficiency anemia, unspecified: Secondary | ICD-10-CM | POA: Diagnosis not present

## 2018-04-23 DIAGNOSIS — D509 Iron deficiency anemia, unspecified: Secondary | ICD-10-CM | POA: Diagnosis not present

## 2018-04-23 DIAGNOSIS — N186 End stage renal disease: Secondary | ICD-10-CM | POA: Diagnosis not present

## 2018-04-23 DIAGNOSIS — D631 Anemia in chronic kidney disease: Secondary | ICD-10-CM | POA: Diagnosis not present

## 2018-04-23 DIAGNOSIS — N2581 Secondary hyperparathyroidism of renal origin: Secondary | ICD-10-CM | POA: Diagnosis not present

## 2018-04-27 DIAGNOSIS — N186 End stage renal disease: Secondary | ICD-10-CM | POA: Diagnosis not present

## 2018-04-27 DIAGNOSIS — I129 Hypertensive chronic kidney disease with stage 1 through stage 4 chronic kidney disease, or unspecified chronic kidney disease: Secondary | ICD-10-CM | POA: Diagnosis not present

## 2018-04-27 DIAGNOSIS — Z992 Dependence on renal dialysis: Secondary | ICD-10-CM | POA: Diagnosis not present

## 2018-04-28 DIAGNOSIS — D509 Iron deficiency anemia, unspecified: Secondary | ICD-10-CM | POA: Diagnosis not present

## 2018-04-28 DIAGNOSIS — N2581 Secondary hyperparathyroidism of renal origin: Secondary | ICD-10-CM | POA: Diagnosis not present

## 2018-04-28 DIAGNOSIS — D631 Anemia in chronic kidney disease: Secondary | ICD-10-CM | POA: Diagnosis not present

## 2018-04-28 DIAGNOSIS — N186 End stage renal disease: Secondary | ICD-10-CM | POA: Diagnosis not present

## 2018-04-30 DIAGNOSIS — N186 End stage renal disease: Secondary | ICD-10-CM | POA: Diagnosis not present

## 2018-04-30 DIAGNOSIS — D509 Iron deficiency anemia, unspecified: Secondary | ICD-10-CM | POA: Diagnosis not present

## 2018-04-30 DIAGNOSIS — D631 Anemia in chronic kidney disease: Secondary | ICD-10-CM | POA: Diagnosis not present

## 2018-04-30 DIAGNOSIS — N2581 Secondary hyperparathyroidism of renal origin: Secondary | ICD-10-CM | POA: Diagnosis not present

## 2018-05-02 DIAGNOSIS — D631 Anemia in chronic kidney disease: Secondary | ICD-10-CM | POA: Diagnosis not present

## 2018-05-02 DIAGNOSIS — D509 Iron deficiency anemia, unspecified: Secondary | ICD-10-CM | POA: Diagnosis not present

## 2018-05-02 DIAGNOSIS — N186 End stage renal disease: Secondary | ICD-10-CM | POA: Diagnosis not present

## 2018-05-02 DIAGNOSIS — N2581 Secondary hyperparathyroidism of renal origin: Secondary | ICD-10-CM | POA: Diagnosis not present

## 2018-05-05 DIAGNOSIS — N2581 Secondary hyperparathyroidism of renal origin: Secondary | ICD-10-CM | POA: Diagnosis not present

## 2018-05-05 DIAGNOSIS — N186 End stage renal disease: Secondary | ICD-10-CM | POA: Diagnosis not present

## 2018-05-05 DIAGNOSIS — D631 Anemia in chronic kidney disease: Secondary | ICD-10-CM | POA: Diagnosis not present

## 2018-05-05 DIAGNOSIS — D509 Iron deficiency anemia, unspecified: Secondary | ICD-10-CM | POA: Diagnosis not present

## 2018-05-07 DIAGNOSIS — D509 Iron deficiency anemia, unspecified: Secondary | ICD-10-CM | POA: Diagnosis not present

## 2018-05-07 DIAGNOSIS — N2581 Secondary hyperparathyroidism of renal origin: Secondary | ICD-10-CM | POA: Diagnosis not present

## 2018-05-07 DIAGNOSIS — D631 Anemia in chronic kidney disease: Secondary | ICD-10-CM | POA: Diagnosis not present

## 2018-05-07 DIAGNOSIS — N186 End stage renal disease: Secondary | ICD-10-CM | POA: Diagnosis not present

## 2018-05-09 DIAGNOSIS — N2581 Secondary hyperparathyroidism of renal origin: Secondary | ICD-10-CM | POA: Diagnosis not present

## 2018-05-09 DIAGNOSIS — N186 End stage renal disease: Secondary | ICD-10-CM | POA: Diagnosis not present

## 2018-05-09 DIAGNOSIS — D509 Iron deficiency anemia, unspecified: Secondary | ICD-10-CM | POA: Diagnosis not present

## 2018-05-09 DIAGNOSIS — D631 Anemia in chronic kidney disease: Secondary | ICD-10-CM | POA: Diagnosis not present

## 2018-05-12 DIAGNOSIS — N2581 Secondary hyperparathyroidism of renal origin: Secondary | ICD-10-CM | POA: Diagnosis not present

## 2018-05-12 DIAGNOSIS — D509 Iron deficiency anemia, unspecified: Secondary | ICD-10-CM | POA: Diagnosis not present

## 2018-05-12 DIAGNOSIS — N186 End stage renal disease: Secondary | ICD-10-CM | POA: Diagnosis not present

## 2018-05-12 DIAGNOSIS — D631 Anemia in chronic kidney disease: Secondary | ICD-10-CM | POA: Diagnosis not present

## 2018-05-14 DIAGNOSIS — D509 Iron deficiency anemia, unspecified: Secondary | ICD-10-CM | POA: Diagnosis not present

## 2018-05-14 DIAGNOSIS — N2581 Secondary hyperparathyroidism of renal origin: Secondary | ICD-10-CM | POA: Diagnosis not present

## 2018-05-14 DIAGNOSIS — N186 End stage renal disease: Secondary | ICD-10-CM | POA: Diagnosis not present

## 2018-05-14 DIAGNOSIS — D631 Anemia in chronic kidney disease: Secondary | ICD-10-CM | POA: Diagnosis not present

## 2018-05-16 DIAGNOSIS — D509 Iron deficiency anemia, unspecified: Secondary | ICD-10-CM | POA: Diagnosis not present

## 2018-05-16 DIAGNOSIS — D631 Anemia in chronic kidney disease: Secondary | ICD-10-CM | POA: Diagnosis not present

## 2018-05-16 DIAGNOSIS — N2581 Secondary hyperparathyroidism of renal origin: Secondary | ICD-10-CM | POA: Diagnosis not present

## 2018-05-16 DIAGNOSIS — N186 End stage renal disease: Secondary | ICD-10-CM | POA: Diagnosis not present

## 2018-05-19 DIAGNOSIS — N186 End stage renal disease: Secondary | ICD-10-CM | POA: Diagnosis not present

## 2018-05-19 DIAGNOSIS — D509 Iron deficiency anemia, unspecified: Secondary | ICD-10-CM | POA: Diagnosis not present

## 2018-05-19 DIAGNOSIS — D631 Anemia in chronic kidney disease: Secondary | ICD-10-CM | POA: Diagnosis not present

## 2018-05-19 DIAGNOSIS — N2581 Secondary hyperparathyroidism of renal origin: Secondary | ICD-10-CM | POA: Diagnosis not present

## 2018-05-26 DIAGNOSIS — D631 Anemia in chronic kidney disease: Secondary | ICD-10-CM | POA: Diagnosis not present

## 2018-05-26 DIAGNOSIS — N186 End stage renal disease: Secondary | ICD-10-CM | POA: Diagnosis not present

## 2018-05-26 DIAGNOSIS — D509 Iron deficiency anemia, unspecified: Secondary | ICD-10-CM | POA: Diagnosis not present

## 2018-05-26 DIAGNOSIS — N2581 Secondary hyperparathyroidism of renal origin: Secondary | ICD-10-CM | POA: Diagnosis not present

## 2018-05-28 DIAGNOSIS — N186 End stage renal disease: Secondary | ICD-10-CM | POA: Diagnosis not present

## 2018-05-28 DIAGNOSIS — Z992 Dependence on renal dialysis: Secondary | ICD-10-CM | POA: Diagnosis not present

## 2018-05-28 DIAGNOSIS — D631 Anemia in chronic kidney disease: Secondary | ICD-10-CM | POA: Diagnosis not present

## 2018-05-28 DIAGNOSIS — D509 Iron deficiency anemia, unspecified: Secondary | ICD-10-CM | POA: Diagnosis not present

## 2018-05-28 DIAGNOSIS — N2581 Secondary hyperparathyroidism of renal origin: Secondary | ICD-10-CM | POA: Diagnosis not present

## 2018-05-28 DIAGNOSIS — I129 Hypertensive chronic kidney disease with stage 1 through stage 4 chronic kidney disease, or unspecified chronic kidney disease: Secondary | ICD-10-CM | POA: Diagnosis not present

## 2018-05-29 DIAGNOSIS — I129 Hypertensive chronic kidney disease with stage 1 through stage 4 chronic kidney disease, or unspecified chronic kidney disease: Secondary | ICD-10-CM | POA: Diagnosis not present

## 2018-05-29 DIAGNOSIS — Z992 Dependence on renal dialysis: Secondary | ICD-10-CM | POA: Diagnosis not present

## 2018-05-29 DIAGNOSIS — N186 End stage renal disease: Secondary | ICD-10-CM | POA: Diagnosis not present

## 2018-05-30 DIAGNOSIS — N2581 Secondary hyperparathyroidism of renal origin: Secondary | ICD-10-CM | POA: Diagnosis not present

## 2018-05-30 DIAGNOSIS — D631 Anemia in chronic kidney disease: Secondary | ICD-10-CM | POA: Diagnosis not present

## 2018-05-30 DIAGNOSIS — N186 End stage renal disease: Secondary | ICD-10-CM | POA: Diagnosis not present

## 2018-05-30 DIAGNOSIS — D509 Iron deficiency anemia, unspecified: Secondary | ICD-10-CM | POA: Diagnosis not present

## 2018-06-02 DIAGNOSIS — N186 End stage renal disease: Secondary | ICD-10-CM | POA: Diagnosis not present

## 2018-06-02 DIAGNOSIS — N2581 Secondary hyperparathyroidism of renal origin: Secondary | ICD-10-CM | POA: Diagnosis not present

## 2018-06-02 DIAGNOSIS — D509 Iron deficiency anemia, unspecified: Secondary | ICD-10-CM | POA: Diagnosis not present

## 2018-06-02 DIAGNOSIS — D631 Anemia in chronic kidney disease: Secondary | ICD-10-CM | POA: Diagnosis not present

## 2018-06-04 DIAGNOSIS — D631 Anemia in chronic kidney disease: Secondary | ICD-10-CM | POA: Diagnosis not present

## 2018-06-04 DIAGNOSIS — N2581 Secondary hyperparathyroidism of renal origin: Secondary | ICD-10-CM | POA: Diagnosis not present

## 2018-06-04 DIAGNOSIS — D509 Iron deficiency anemia, unspecified: Secondary | ICD-10-CM | POA: Diagnosis not present

## 2018-06-04 DIAGNOSIS — N186 End stage renal disease: Secondary | ICD-10-CM | POA: Diagnosis not present

## 2018-06-06 DIAGNOSIS — D509 Iron deficiency anemia, unspecified: Secondary | ICD-10-CM | POA: Diagnosis not present

## 2018-06-06 DIAGNOSIS — D631 Anemia in chronic kidney disease: Secondary | ICD-10-CM | POA: Diagnosis not present

## 2018-06-06 DIAGNOSIS — N2581 Secondary hyperparathyroidism of renal origin: Secondary | ICD-10-CM | POA: Diagnosis not present

## 2018-06-06 DIAGNOSIS — N186 End stage renal disease: Secondary | ICD-10-CM | POA: Diagnosis not present

## 2018-06-09 DIAGNOSIS — N2581 Secondary hyperparathyroidism of renal origin: Secondary | ICD-10-CM | POA: Diagnosis not present

## 2018-06-09 DIAGNOSIS — D509 Iron deficiency anemia, unspecified: Secondary | ICD-10-CM | POA: Diagnosis not present

## 2018-06-09 DIAGNOSIS — N186 End stage renal disease: Secondary | ICD-10-CM | POA: Diagnosis not present

## 2018-06-09 DIAGNOSIS — D631 Anemia in chronic kidney disease: Secondary | ICD-10-CM | POA: Diagnosis not present

## 2018-06-11 DIAGNOSIS — N186 End stage renal disease: Secondary | ICD-10-CM | POA: Diagnosis not present

## 2018-06-11 DIAGNOSIS — D509 Iron deficiency anemia, unspecified: Secondary | ICD-10-CM | POA: Diagnosis not present

## 2018-06-11 DIAGNOSIS — D631 Anemia in chronic kidney disease: Secondary | ICD-10-CM | POA: Diagnosis not present

## 2018-06-11 DIAGNOSIS — N2581 Secondary hyperparathyroidism of renal origin: Secondary | ICD-10-CM | POA: Diagnosis not present

## 2018-06-13 DIAGNOSIS — D631 Anemia in chronic kidney disease: Secondary | ICD-10-CM | POA: Diagnosis not present

## 2018-06-13 DIAGNOSIS — D509 Iron deficiency anemia, unspecified: Secondary | ICD-10-CM | POA: Diagnosis not present

## 2018-06-13 DIAGNOSIS — N2581 Secondary hyperparathyroidism of renal origin: Secondary | ICD-10-CM | POA: Diagnosis not present

## 2018-06-13 DIAGNOSIS — N186 End stage renal disease: Secondary | ICD-10-CM | POA: Diagnosis not present

## 2018-06-16 DIAGNOSIS — N186 End stage renal disease: Secondary | ICD-10-CM | POA: Diagnosis not present

## 2018-06-16 DIAGNOSIS — D509 Iron deficiency anemia, unspecified: Secondary | ICD-10-CM | POA: Diagnosis not present

## 2018-06-16 DIAGNOSIS — D631 Anemia in chronic kidney disease: Secondary | ICD-10-CM | POA: Diagnosis not present

## 2018-06-16 DIAGNOSIS — N2581 Secondary hyperparathyroidism of renal origin: Secondary | ICD-10-CM | POA: Diagnosis not present

## 2018-06-18 DIAGNOSIS — D509 Iron deficiency anemia, unspecified: Secondary | ICD-10-CM | POA: Diagnosis not present

## 2018-06-18 DIAGNOSIS — N2581 Secondary hyperparathyroidism of renal origin: Secondary | ICD-10-CM | POA: Diagnosis not present

## 2018-06-18 DIAGNOSIS — D631 Anemia in chronic kidney disease: Secondary | ICD-10-CM | POA: Diagnosis not present

## 2018-06-18 DIAGNOSIS — N186 End stage renal disease: Secondary | ICD-10-CM | POA: Diagnosis not present

## 2018-06-20 DIAGNOSIS — N186 End stage renal disease: Secondary | ICD-10-CM | POA: Diagnosis not present

## 2018-06-20 DIAGNOSIS — D631 Anemia in chronic kidney disease: Secondary | ICD-10-CM | POA: Diagnosis not present

## 2018-06-20 DIAGNOSIS — D509 Iron deficiency anemia, unspecified: Secondary | ICD-10-CM | POA: Diagnosis not present

## 2018-06-20 DIAGNOSIS — N2581 Secondary hyperparathyroidism of renal origin: Secondary | ICD-10-CM | POA: Diagnosis not present

## 2018-06-22 DIAGNOSIS — D631 Anemia in chronic kidney disease: Secondary | ICD-10-CM | POA: Diagnosis not present

## 2018-06-22 DIAGNOSIS — N186 End stage renal disease: Secondary | ICD-10-CM | POA: Diagnosis not present

## 2018-06-22 DIAGNOSIS — D509 Iron deficiency anemia, unspecified: Secondary | ICD-10-CM | POA: Diagnosis not present

## 2018-06-22 DIAGNOSIS — N2581 Secondary hyperparathyroidism of renal origin: Secondary | ICD-10-CM | POA: Diagnosis not present

## 2018-06-27 DIAGNOSIS — D509 Iron deficiency anemia, unspecified: Secondary | ICD-10-CM | POA: Diagnosis not present

## 2018-06-27 DIAGNOSIS — N186 End stage renal disease: Secondary | ICD-10-CM | POA: Diagnosis not present

## 2018-06-27 DIAGNOSIS — D631 Anemia in chronic kidney disease: Secondary | ICD-10-CM | POA: Diagnosis not present

## 2018-06-27 DIAGNOSIS — N2581 Secondary hyperparathyroidism of renal origin: Secondary | ICD-10-CM | POA: Diagnosis not present

## 2018-06-30 DIAGNOSIS — N186 End stage renal disease: Secondary | ICD-10-CM | POA: Diagnosis not present

## 2018-06-30 DIAGNOSIS — D631 Anemia in chronic kidney disease: Secondary | ICD-10-CM | POA: Diagnosis not present

## 2018-06-30 DIAGNOSIS — N2581 Secondary hyperparathyroidism of renal origin: Secondary | ICD-10-CM | POA: Diagnosis not present

## 2018-07-02 DIAGNOSIS — D631 Anemia in chronic kidney disease: Secondary | ICD-10-CM | POA: Diagnosis not present

## 2018-07-02 DIAGNOSIS — N2581 Secondary hyperparathyroidism of renal origin: Secondary | ICD-10-CM | POA: Diagnosis not present

## 2018-07-02 DIAGNOSIS — N186 End stage renal disease: Secondary | ICD-10-CM | POA: Diagnosis not present

## 2018-07-06 DIAGNOSIS — D631 Anemia in chronic kidney disease: Secondary | ICD-10-CM | POA: Diagnosis not present

## 2018-07-06 DIAGNOSIS — N2581 Secondary hyperparathyroidism of renal origin: Secondary | ICD-10-CM | POA: Diagnosis not present

## 2018-07-06 DIAGNOSIS — N186 End stage renal disease: Secondary | ICD-10-CM | POA: Diagnosis not present

## 2018-07-08 DIAGNOSIS — D631 Anemia in chronic kidney disease: Secondary | ICD-10-CM | POA: Diagnosis not present

## 2018-07-08 DIAGNOSIS — N2581 Secondary hyperparathyroidism of renal origin: Secondary | ICD-10-CM | POA: Diagnosis not present

## 2018-07-08 DIAGNOSIS — N186 End stage renal disease: Secondary | ICD-10-CM | POA: Diagnosis not present

## 2018-07-11 DIAGNOSIS — N2581 Secondary hyperparathyroidism of renal origin: Secondary | ICD-10-CM | POA: Diagnosis not present

## 2018-07-11 DIAGNOSIS — N186 End stage renal disease: Secondary | ICD-10-CM | POA: Diagnosis not present

## 2018-07-11 DIAGNOSIS — D631 Anemia in chronic kidney disease: Secondary | ICD-10-CM | POA: Diagnosis not present

## 2018-07-13 DIAGNOSIS — N186 End stage renal disease: Secondary | ICD-10-CM | POA: Diagnosis not present

## 2018-07-13 DIAGNOSIS — N2581 Secondary hyperparathyroidism of renal origin: Secondary | ICD-10-CM | POA: Diagnosis not present

## 2018-07-13 DIAGNOSIS — D631 Anemia in chronic kidney disease: Secondary | ICD-10-CM | POA: Diagnosis not present

## 2018-07-15 DIAGNOSIS — N2581 Secondary hyperparathyroidism of renal origin: Secondary | ICD-10-CM | POA: Diagnosis not present

## 2018-07-15 DIAGNOSIS — N186 End stage renal disease: Secondary | ICD-10-CM | POA: Diagnosis not present

## 2018-07-15 DIAGNOSIS — D631 Anemia in chronic kidney disease: Secondary | ICD-10-CM | POA: Diagnosis not present

## 2018-07-17 DIAGNOSIS — N186 End stage renal disease: Secondary | ICD-10-CM | POA: Diagnosis not present

## 2018-07-17 DIAGNOSIS — D631 Anemia in chronic kidney disease: Secondary | ICD-10-CM | POA: Diagnosis not present

## 2018-07-17 DIAGNOSIS — N2581 Secondary hyperparathyroidism of renal origin: Secondary | ICD-10-CM | POA: Diagnosis not present

## 2018-07-21 DIAGNOSIS — N186 End stage renal disease: Secondary | ICD-10-CM | POA: Diagnosis not present

## 2018-07-21 DIAGNOSIS — N2581 Secondary hyperparathyroidism of renal origin: Secondary | ICD-10-CM | POA: Diagnosis not present

## 2018-07-21 DIAGNOSIS — D631 Anemia in chronic kidney disease: Secondary | ICD-10-CM | POA: Diagnosis not present

## 2018-07-24 DIAGNOSIS — N2581 Secondary hyperparathyroidism of renal origin: Secondary | ICD-10-CM | POA: Diagnosis not present

## 2018-07-24 DIAGNOSIS — D631 Anemia in chronic kidney disease: Secondary | ICD-10-CM | POA: Diagnosis not present

## 2018-07-24 DIAGNOSIS — N186 End stage renal disease: Secondary | ICD-10-CM | POA: Diagnosis not present

## 2018-07-26 DIAGNOSIS — D631 Anemia in chronic kidney disease: Secondary | ICD-10-CM | POA: Diagnosis not present

## 2018-07-26 DIAGNOSIS — N186 End stage renal disease: Secondary | ICD-10-CM | POA: Diagnosis not present

## 2018-07-26 DIAGNOSIS — N2581 Secondary hyperparathyroidism of renal origin: Secondary | ICD-10-CM | POA: Diagnosis not present

## 2018-07-28 DIAGNOSIS — N186 End stage renal disease: Secondary | ICD-10-CM | POA: Diagnosis not present

## 2018-07-28 DIAGNOSIS — Z992 Dependence on renal dialysis: Secondary | ICD-10-CM | POA: Diagnosis not present

## 2018-07-28 DIAGNOSIS — I129 Hypertensive chronic kidney disease with stage 1 through stage 4 chronic kidney disease, or unspecified chronic kidney disease: Secondary | ICD-10-CM | POA: Diagnosis not present

## 2018-07-31 DIAGNOSIS — N186 End stage renal disease: Secondary | ICD-10-CM | POA: Diagnosis not present

## 2018-07-31 DIAGNOSIS — N2581 Secondary hyperparathyroidism of renal origin: Secondary | ICD-10-CM | POA: Diagnosis not present

## 2018-07-31 DIAGNOSIS — D631 Anemia in chronic kidney disease: Secondary | ICD-10-CM | POA: Diagnosis not present

## 2018-08-03 DIAGNOSIS — N186 End stage renal disease: Secondary | ICD-10-CM | POA: Diagnosis not present

## 2018-08-03 DIAGNOSIS — D631 Anemia in chronic kidney disease: Secondary | ICD-10-CM | POA: Diagnosis not present

## 2018-08-03 DIAGNOSIS — N2581 Secondary hyperparathyroidism of renal origin: Secondary | ICD-10-CM | POA: Diagnosis not present

## 2018-08-05 DIAGNOSIS — D631 Anemia in chronic kidney disease: Secondary | ICD-10-CM | POA: Diagnosis not present

## 2018-08-05 DIAGNOSIS — N186 End stage renal disease: Secondary | ICD-10-CM | POA: Diagnosis not present

## 2018-08-05 DIAGNOSIS — N2581 Secondary hyperparathyroidism of renal origin: Secondary | ICD-10-CM | POA: Diagnosis not present

## 2018-08-07 DIAGNOSIS — N2581 Secondary hyperparathyroidism of renal origin: Secondary | ICD-10-CM | POA: Diagnosis not present

## 2018-08-07 DIAGNOSIS — D631 Anemia in chronic kidney disease: Secondary | ICD-10-CM | POA: Diagnosis not present

## 2018-08-07 DIAGNOSIS — N186 End stage renal disease: Secondary | ICD-10-CM | POA: Diagnosis not present

## 2018-08-10 DIAGNOSIS — N2581 Secondary hyperparathyroidism of renal origin: Secondary | ICD-10-CM | POA: Diagnosis not present

## 2018-08-10 DIAGNOSIS — N186 End stage renal disease: Secondary | ICD-10-CM | POA: Diagnosis not present

## 2018-08-10 DIAGNOSIS — D631 Anemia in chronic kidney disease: Secondary | ICD-10-CM | POA: Diagnosis not present

## 2018-08-12 DIAGNOSIS — D631 Anemia in chronic kidney disease: Secondary | ICD-10-CM | POA: Diagnosis not present

## 2018-08-12 DIAGNOSIS — N186 End stage renal disease: Secondary | ICD-10-CM | POA: Diagnosis not present

## 2018-08-12 DIAGNOSIS — N2581 Secondary hyperparathyroidism of renal origin: Secondary | ICD-10-CM | POA: Diagnosis not present

## 2018-08-15 DIAGNOSIS — D631 Anemia in chronic kidney disease: Secondary | ICD-10-CM | POA: Diagnosis not present

## 2018-08-15 DIAGNOSIS — N186 End stage renal disease: Secondary | ICD-10-CM | POA: Diagnosis not present

## 2018-08-15 DIAGNOSIS — N2581 Secondary hyperparathyroidism of renal origin: Secondary | ICD-10-CM | POA: Diagnosis not present

## 2018-08-19 DIAGNOSIS — N186 End stage renal disease: Secondary | ICD-10-CM | POA: Diagnosis not present

## 2018-08-19 DIAGNOSIS — N2581 Secondary hyperparathyroidism of renal origin: Secondary | ICD-10-CM | POA: Diagnosis not present

## 2018-08-19 DIAGNOSIS — D631 Anemia in chronic kidney disease: Secondary | ICD-10-CM | POA: Diagnosis not present

## 2018-08-21 DIAGNOSIS — N2581 Secondary hyperparathyroidism of renal origin: Secondary | ICD-10-CM | POA: Diagnosis not present

## 2018-08-21 DIAGNOSIS — N186 End stage renal disease: Secondary | ICD-10-CM | POA: Diagnosis not present

## 2018-08-21 DIAGNOSIS — D631 Anemia in chronic kidney disease: Secondary | ICD-10-CM | POA: Diagnosis not present

## 2018-08-24 DIAGNOSIS — N2581 Secondary hyperparathyroidism of renal origin: Secondary | ICD-10-CM | POA: Diagnosis not present

## 2018-08-24 DIAGNOSIS — D631 Anemia in chronic kidney disease: Secondary | ICD-10-CM | POA: Diagnosis not present

## 2018-08-24 DIAGNOSIS — N186 End stage renal disease: Secondary | ICD-10-CM | POA: Diagnosis not present

## 2018-08-28 DIAGNOSIS — Z992 Dependence on renal dialysis: Secondary | ICD-10-CM | POA: Diagnosis not present

## 2018-08-28 DIAGNOSIS — N186 End stage renal disease: Secondary | ICD-10-CM | POA: Diagnosis not present

## 2018-08-28 DIAGNOSIS — I129 Hypertensive chronic kidney disease with stage 1 through stage 4 chronic kidney disease, or unspecified chronic kidney disease: Secondary | ICD-10-CM | POA: Diagnosis not present

## 2018-08-31 DIAGNOSIS — N186 End stage renal disease: Secondary | ICD-10-CM | POA: Diagnosis not present

## 2018-08-31 DIAGNOSIS — D631 Anemia in chronic kidney disease: Secondary | ICD-10-CM | POA: Diagnosis not present

## 2018-08-31 DIAGNOSIS — N2581 Secondary hyperparathyroidism of renal origin: Secondary | ICD-10-CM | POA: Diagnosis not present

## 2018-09-02 DIAGNOSIS — N2581 Secondary hyperparathyroidism of renal origin: Secondary | ICD-10-CM | POA: Diagnosis not present

## 2018-09-02 DIAGNOSIS — D631 Anemia in chronic kidney disease: Secondary | ICD-10-CM | POA: Diagnosis not present

## 2018-09-02 DIAGNOSIS — N186 End stage renal disease: Secondary | ICD-10-CM | POA: Diagnosis not present

## 2018-09-04 DIAGNOSIS — D631 Anemia in chronic kidney disease: Secondary | ICD-10-CM | POA: Diagnosis not present

## 2018-09-04 DIAGNOSIS — N2581 Secondary hyperparathyroidism of renal origin: Secondary | ICD-10-CM | POA: Diagnosis not present

## 2018-09-04 DIAGNOSIS — N186 End stage renal disease: Secondary | ICD-10-CM | POA: Diagnosis not present

## 2018-09-07 DIAGNOSIS — N2581 Secondary hyperparathyroidism of renal origin: Secondary | ICD-10-CM | POA: Diagnosis not present

## 2018-09-07 DIAGNOSIS — N186 End stage renal disease: Secondary | ICD-10-CM | POA: Diagnosis not present

## 2018-09-07 DIAGNOSIS — D631 Anemia in chronic kidney disease: Secondary | ICD-10-CM | POA: Diagnosis not present

## 2018-09-09 DIAGNOSIS — D631 Anemia in chronic kidney disease: Secondary | ICD-10-CM | POA: Diagnosis not present

## 2018-09-09 DIAGNOSIS — N186 End stage renal disease: Secondary | ICD-10-CM | POA: Diagnosis not present

## 2018-09-09 DIAGNOSIS — N2581 Secondary hyperparathyroidism of renal origin: Secondary | ICD-10-CM | POA: Diagnosis not present

## 2018-09-11 DIAGNOSIS — N186 End stage renal disease: Secondary | ICD-10-CM | POA: Diagnosis not present

## 2018-09-11 DIAGNOSIS — D631 Anemia in chronic kidney disease: Secondary | ICD-10-CM | POA: Diagnosis not present

## 2018-09-11 DIAGNOSIS — N2581 Secondary hyperparathyroidism of renal origin: Secondary | ICD-10-CM | POA: Diagnosis not present

## 2018-09-14 DIAGNOSIS — D631 Anemia in chronic kidney disease: Secondary | ICD-10-CM | POA: Diagnosis not present

## 2018-09-14 DIAGNOSIS — N186 End stage renal disease: Secondary | ICD-10-CM | POA: Diagnosis not present

## 2018-09-14 DIAGNOSIS — N2581 Secondary hyperparathyroidism of renal origin: Secondary | ICD-10-CM | POA: Diagnosis not present

## 2018-09-16 DIAGNOSIS — N186 End stage renal disease: Secondary | ICD-10-CM | POA: Diagnosis not present

## 2018-09-16 DIAGNOSIS — N2581 Secondary hyperparathyroidism of renal origin: Secondary | ICD-10-CM | POA: Diagnosis not present

## 2018-09-16 DIAGNOSIS — D631 Anemia in chronic kidney disease: Secondary | ICD-10-CM | POA: Diagnosis not present

## 2018-09-18 ENCOUNTER — Other Ambulatory Visit: Payer: Self-pay | Admitting: Family Medicine

## 2018-09-18 DIAGNOSIS — I10 Essential (primary) hypertension: Secondary | ICD-10-CM

## 2018-09-18 MED ORDER — METOPROLOL SUCCINATE ER 25 MG PO TB24: 25.0000 mg | ORAL_TABLET | Freq: Every day | ORAL | 0 refills | Status: DC

## 2018-09-18 MED ORDER — AMLODIPINE BESYLATE 10 MG PO TABS
10.0000 mg | ORAL_TABLET | Freq: Every day | ORAL | 0 refills | Status: DC
Start: 2018-09-18 — End: 2019-07-06

## 2018-09-21 DIAGNOSIS — N2581 Secondary hyperparathyroidism of renal origin: Secondary | ICD-10-CM | POA: Diagnosis not present

## 2018-09-21 DIAGNOSIS — N186 End stage renal disease: Secondary | ICD-10-CM | POA: Diagnosis not present

## 2018-09-21 DIAGNOSIS — D631 Anemia in chronic kidney disease: Secondary | ICD-10-CM | POA: Diagnosis not present

## 2018-09-23 DIAGNOSIS — D631 Anemia in chronic kidney disease: Secondary | ICD-10-CM | POA: Diagnosis not present

## 2018-09-23 DIAGNOSIS — N186 End stage renal disease: Secondary | ICD-10-CM | POA: Diagnosis not present

## 2018-09-23 DIAGNOSIS — N2581 Secondary hyperparathyroidism of renal origin: Secondary | ICD-10-CM | POA: Diagnosis not present

## 2018-09-25 DIAGNOSIS — N186 End stage renal disease: Secondary | ICD-10-CM | POA: Diagnosis not present

## 2018-09-25 DIAGNOSIS — D631 Anemia in chronic kidney disease: Secondary | ICD-10-CM | POA: Diagnosis not present

## 2018-09-25 DIAGNOSIS — N2581 Secondary hyperparathyroidism of renal origin: Secondary | ICD-10-CM | POA: Diagnosis not present

## 2018-09-26 DIAGNOSIS — I129 Hypertensive chronic kidney disease with stage 1 through stage 4 chronic kidney disease, or unspecified chronic kidney disease: Secondary | ICD-10-CM | POA: Diagnosis not present

## 2018-09-26 DIAGNOSIS — N186 End stage renal disease: Secondary | ICD-10-CM | POA: Diagnosis not present

## 2018-09-26 DIAGNOSIS — Z992 Dependence on renal dialysis: Secondary | ICD-10-CM | POA: Diagnosis not present

## 2018-09-28 DIAGNOSIS — M25551 Pain in right hip: Secondary | ICD-10-CM | POA: Diagnosis not present

## 2018-09-28 DIAGNOSIS — M329 Systemic lupus erythematosus, unspecified: Secondary | ICD-10-CM | POA: Diagnosis not present

## 2018-09-28 DIAGNOSIS — E669 Obesity, unspecified: Secondary | ICD-10-CM | POA: Diagnosis not present

## 2018-09-28 DIAGNOSIS — Z6832 Body mass index (BMI) 32.0-32.9, adult: Secondary | ICD-10-CM | POA: Diagnosis not present

## 2018-10-02 DIAGNOSIS — D631 Anemia in chronic kidney disease: Secondary | ICD-10-CM | POA: Diagnosis not present

## 2018-10-02 DIAGNOSIS — N186 End stage renal disease: Secondary | ICD-10-CM | POA: Diagnosis not present

## 2018-10-02 DIAGNOSIS — N2581 Secondary hyperparathyroidism of renal origin: Secondary | ICD-10-CM | POA: Diagnosis not present

## 2018-10-07 DIAGNOSIS — N186 End stage renal disease: Secondary | ICD-10-CM | POA: Diagnosis not present

## 2018-10-07 DIAGNOSIS — D631 Anemia in chronic kidney disease: Secondary | ICD-10-CM | POA: Diagnosis not present

## 2018-10-07 DIAGNOSIS — N2581 Secondary hyperparathyroidism of renal origin: Secondary | ICD-10-CM | POA: Diagnosis not present

## 2018-10-09 DIAGNOSIS — N2581 Secondary hyperparathyroidism of renal origin: Secondary | ICD-10-CM | POA: Diagnosis not present

## 2018-10-09 DIAGNOSIS — D631 Anemia in chronic kidney disease: Secondary | ICD-10-CM | POA: Diagnosis not present

## 2018-10-09 DIAGNOSIS — N186 End stage renal disease: Secondary | ICD-10-CM | POA: Diagnosis not present

## 2018-10-12 ENCOUNTER — Ambulatory Visit: Payer: MEDICARE | Admitting: Family Medicine

## 2018-10-14 DIAGNOSIS — N2581 Secondary hyperparathyroidism of renal origin: Secondary | ICD-10-CM | POA: Diagnosis not present

## 2018-10-14 DIAGNOSIS — D631 Anemia in chronic kidney disease: Secondary | ICD-10-CM | POA: Diagnosis not present

## 2018-10-14 DIAGNOSIS — N186 End stage renal disease: Secondary | ICD-10-CM | POA: Diagnosis not present

## 2018-10-16 DIAGNOSIS — N186 End stage renal disease: Secondary | ICD-10-CM | POA: Diagnosis not present

## 2018-10-16 DIAGNOSIS — D631 Anemia in chronic kidney disease: Secondary | ICD-10-CM | POA: Diagnosis not present

## 2018-10-16 DIAGNOSIS — N2581 Secondary hyperparathyroidism of renal origin: Secondary | ICD-10-CM | POA: Diagnosis not present

## 2018-10-19 DIAGNOSIS — D631 Anemia in chronic kidney disease: Secondary | ICD-10-CM | POA: Diagnosis not present

## 2018-10-19 DIAGNOSIS — N2581 Secondary hyperparathyroidism of renal origin: Secondary | ICD-10-CM | POA: Diagnosis not present

## 2018-10-19 DIAGNOSIS — N186 End stage renal disease: Secondary | ICD-10-CM | POA: Diagnosis not present

## 2018-10-21 DIAGNOSIS — D631 Anemia in chronic kidney disease: Secondary | ICD-10-CM | POA: Diagnosis not present

## 2018-10-21 DIAGNOSIS — N2581 Secondary hyperparathyroidism of renal origin: Secondary | ICD-10-CM | POA: Diagnosis not present

## 2018-10-21 DIAGNOSIS — N186 End stage renal disease: Secondary | ICD-10-CM | POA: Diagnosis not present

## 2018-10-22 DIAGNOSIS — H35033 Hypertensive retinopathy, bilateral: Secondary | ICD-10-CM | POA: Diagnosis not present

## 2018-10-22 DIAGNOSIS — H2513 Age-related nuclear cataract, bilateral: Secondary | ICD-10-CM | POA: Diagnosis not present

## 2018-10-22 DIAGNOSIS — I7090 Unspecified atherosclerosis: Secondary | ICD-10-CM | POA: Diagnosis not present

## 2018-10-22 DIAGNOSIS — Z79899 Other long term (current) drug therapy: Secondary | ICD-10-CM | POA: Diagnosis not present

## 2018-10-23 ENCOUNTER — Telehealth: Payer: Self-pay | Admitting: *Deleted

## 2018-10-23 NOTE — Telephone Encounter (Signed)
Received Comprehensive Eye Exam Report from [facility]; forwarded to provider/SLS 03/27

## 2018-10-26 DIAGNOSIS — D631 Anemia in chronic kidney disease: Secondary | ICD-10-CM | POA: Diagnosis not present

## 2018-10-26 DIAGNOSIS — N2581 Secondary hyperparathyroidism of renal origin: Secondary | ICD-10-CM | POA: Diagnosis not present

## 2018-10-26 DIAGNOSIS — N186 End stage renal disease: Secondary | ICD-10-CM | POA: Diagnosis not present

## 2018-10-27 DIAGNOSIS — I129 Hypertensive chronic kidney disease with stage 1 through stage 4 chronic kidney disease, or unspecified chronic kidney disease: Secondary | ICD-10-CM | POA: Diagnosis not present

## 2018-10-27 DIAGNOSIS — Z992 Dependence on renal dialysis: Secondary | ICD-10-CM | POA: Diagnosis not present

## 2018-10-27 DIAGNOSIS — N186 End stage renal disease: Secondary | ICD-10-CM | POA: Diagnosis not present

## 2018-10-28 DIAGNOSIS — N186 End stage renal disease: Secondary | ICD-10-CM | POA: Diagnosis not present

## 2018-10-28 DIAGNOSIS — N2581 Secondary hyperparathyroidism of renal origin: Secondary | ICD-10-CM | POA: Diagnosis not present

## 2018-10-30 DIAGNOSIS — N186 End stage renal disease: Secondary | ICD-10-CM | POA: Diagnosis not present

## 2018-10-30 DIAGNOSIS — N2581 Secondary hyperparathyroidism of renal origin: Secondary | ICD-10-CM | POA: Diagnosis not present

## 2018-11-02 DIAGNOSIS — N2581 Secondary hyperparathyroidism of renal origin: Secondary | ICD-10-CM | POA: Diagnosis not present

## 2018-11-02 DIAGNOSIS — N186 End stage renal disease: Secondary | ICD-10-CM | POA: Diagnosis not present

## 2018-11-06 DIAGNOSIS — N186 End stage renal disease: Secondary | ICD-10-CM | POA: Diagnosis not present

## 2018-11-06 DIAGNOSIS — N2581 Secondary hyperparathyroidism of renal origin: Secondary | ICD-10-CM | POA: Diagnosis not present

## 2018-11-09 DIAGNOSIS — N2581 Secondary hyperparathyroidism of renal origin: Secondary | ICD-10-CM | POA: Diagnosis not present

## 2018-11-09 DIAGNOSIS — N186 End stage renal disease: Secondary | ICD-10-CM | POA: Diagnosis not present

## 2018-11-14 DIAGNOSIS — N186 End stage renal disease: Secondary | ICD-10-CM | POA: Diagnosis not present

## 2018-11-14 DIAGNOSIS — N2581 Secondary hyperparathyroidism of renal origin: Secondary | ICD-10-CM | POA: Diagnosis not present

## 2018-11-16 DIAGNOSIS — N186 End stage renal disease: Secondary | ICD-10-CM | POA: Diagnosis not present

## 2018-11-16 DIAGNOSIS — N2581 Secondary hyperparathyroidism of renal origin: Secondary | ICD-10-CM | POA: Diagnosis not present

## 2018-11-18 DIAGNOSIS — N2581 Secondary hyperparathyroidism of renal origin: Secondary | ICD-10-CM | POA: Diagnosis not present

## 2018-11-18 DIAGNOSIS — N186 End stage renal disease: Secondary | ICD-10-CM | POA: Diagnosis not present

## 2018-11-20 ENCOUNTER — Telehealth: Payer: Self-pay | Admitting: Family Medicine

## 2018-11-20 DIAGNOSIS — N186 End stage renal disease: Secondary | ICD-10-CM | POA: Diagnosis not present

## 2018-11-20 DIAGNOSIS — N2581 Secondary hyperparathyroidism of renal origin: Secondary | ICD-10-CM | POA: Diagnosis not present

## 2018-11-20 NOTE — Telephone Encounter (Signed)
Patient no showed appt on 10/12/2018 Called to offer a Virtual Visit to keep her on her followup schedule with PCP. The patient verbalized understanding, but was not interested in a Virtual Visit at this time as she is not having any problems now. Stated she would call back if need anything and schedule appt.

## 2018-11-23 DIAGNOSIS — N186 End stage renal disease: Secondary | ICD-10-CM | POA: Diagnosis not present

## 2018-11-23 DIAGNOSIS — N2581 Secondary hyperparathyroidism of renal origin: Secondary | ICD-10-CM | POA: Diagnosis not present

## 2018-11-25 DIAGNOSIS — N186 End stage renal disease: Secondary | ICD-10-CM | POA: Diagnosis not present

## 2018-11-25 DIAGNOSIS — N2581 Secondary hyperparathyroidism of renal origin: Secondary | ICD-10-CM | POA: Diagnosis not present

## 2018-11-26 DIAGNOSIS — I129 Hypertensive chronic kidney disease with stage 1 through stage 4 chronic kidney disease, or unspecified chronic kidney disease: Secondary | ICD-10-CM | POA: Diagnosis not present

## 2018-11-26 DIAGNOSIS — N186 End stage renal disease: Secondary | ICD-10-CM | POA: Diagnosis not present

## 2018-11-26 DIAGNOSIS — Z992 Dependence on renal dialysis: Secondary | ICD-10-CM | POA: Diagnosis not present

## 2018-11-28 DIAGNOSIS — N186 End stage renal disease: Secondary | ICD-10-CM | POA: Diagnosis not present

## 2018-11-28 DIAGNOSIS — N2581 Secondary hyperparathyroidism of renal origin: Secondary | ICD-10-CM | POA: Diagnosis not present

## 2018-11-30 DIAGNOSIS — N186 End stage renal disease: Secondary | ICD-10-CM | POA: Diagnosis not present

## 2018-11-30 DIAGNOSIS — N2581 Secondary hyperparathyroidism of renal origin: Secondary | ICD-10-CM | POA: Diagnosis not present

## 2018-12-04 ENCOUNTER — Telehealth: Payer: Self-pay | Admitting: Family Medicine

## 2018-12-04 ENCOUNTER — Ambulatory Visit: Payer: Self-pay

## 2018-12-04 DIAGNOSIS — N186 End stage renal disease: Secondary | ICD-10-CM | POA: Diagnosis not present

## 2018-12-04 DIAGNOSIS — N2581 Secondary hyperparathyroidism of renal origin: Secondary | ICD-10-CM | POA: Diagnosis not present

## 2018-12-04 NOTE — Telephone Encounter (Signed)
Called left message to call back 

## 2018-12-04 NOTE — Telephone Encounter (Signed)
Called twice left message to call back.

## 2018-12-04 NOTE — Telephone Encounter (Signed)
    [name] Female, 63 y.o., 1956-05-05 MRN:  [id number] Phone:  [phone] [name]) PCP:  Shelda Pal, DO Primary Cvg:  MEDICARE RAILROAD/MEDICARE RAILROAD Message from Nils Flack sent at 12/04/2018 11:05 AM EDT   Summary: bp concerns, no readings    Pt called, she thinks her bp is high. She does not know what the last readings are. She says that dialysis has kept a record. She says that she gets headaches. She would like to know what to do next. Offered to transfer to nurse, but she did not want to, she was heading into dialysis. She wants someone to call her back but not right now. She is going into her dialysis appt now. Pease call back later today. Thanks          Call History    Type Contact  12/04/2018 11:02 AM Phone (Incoming) [name] (Self)  Phone: [phone] (H)  User: Nils Flack

## 2018-12-07 DIAGNOSIS — N186 End stage renal disease: Secondary | ICD-10-CM | POA: Diagnosis not present

## 2018-12-07 DIAGNOSIS — N2581 Secondary hyperparathyroidism of renal origin: Secondary | ICD-10-CM | POA: Diagnosis not present

## 2018-12-08 ENCOUNTER — Other Ambulatory Visit: Payer: Self-pay

## 2018-12-08 ENCOUNTER — Ambulatory Visit (INDEPENDENT_AMBULATORY_CARE_PROVIDER_SITE_OTHER): Payer: MEDICARE | Admitting: Family Medicine

## 2018-12-08 ENCOUNTER — Encounter: Payer: Self-pay | Admitting: Family Medicine

## 2018-12-08 DIAGNOSIS — I10 Essential (primary) hypertension: Secondary | ICD-10-CM

## 2018-12-08 DIAGNOSIS — F418 Other specified anxiety disorders: Secondary | ICD-10-CM | POA: Diagnosis not present

## 2018-12-08 MED ORDER — SERTRALINE HCL 50 MG PO TABS: 50.0000 mg | ORAL_TABLET | Freq: Every day | ORAL | 3 refills | Status: DC

## 2018-12-08 NOTE — Patient Instructions (Signed)
Please consider counseling. Contact [phone] to schedule an appointment or inquire about cost/insurance coverage.  Coping skills Choose 5 that work for you:  Take a deep breath  Count to 20  Read a book  Do a puzzle  Meditate  Bake  Sing  Knit  Garden  Pray  Go outside  Call a friend  Listen to music  Take a walk  Color  Send a note  Take a bath  Watch a movie  Be alone in a quiet place  Pet an animal  Visit a friend  Journal  Exercise  Stretch

## 2018-12-08 NOTE — Progress Notes (Signed)
Chief Complaint  Patient presents with  . Hypertension    Subjective Karen Carr is a 63 y.o. female who presents for hypertension follow up. Due to COVID-19 pandemic, we are interacting via web portal for an electronic face-to-face visit. I verified patient's ID using 2 identifiers. Patient agreed to proceed with visit via this method. Patient is at home, I am at office. Patient and I are present for visit.   She does not monitor home blood pressures. She is compliant with medications- Norvasc 10 mg/d, Toprol XL 25 mg/d. Patient has these side effects of medication: none She is sometimes adhering to a healthy diet overall. Current exercise: walking  Has had increased stress over past 2 weeks. Has had irritability, mood lability, anxiety, tearfulness. Tx'd for this years ago. Moving to [place] has been rough for her and a trigger.   Past Medical History:  Diagnosis Date  . ESRD (end stage renal disease) ([place])   . Gout   . Hyperlipidemia   . Hypertension   . Major depressive disorder   . SLE (systemic lupus erythematosus) ([place])   . Vitamin D deficiency     Review of Systems Cardiovascular: no chest pain Respiratory:  no shortness of breath  Exam There were no vitals taken for this visit. General:  well developed, well nourished, in no apparent distress Heart: RRR, no bruits, no LE edema Lungs: clear to auscultation, no accessory muscle use Psych: well oriented with normal range of affect and appropriate judgment/insight  Situational anxiety - Plan: sertraline (ZOLOFT) 50 MG tablet  Essential hypertension  Start SSRI, 1/2 tab for 2 weeks then 1 full tab. [facility] info to be mailed w anxiety coping.  Counseled on diet and exercise. Hoping if anxiety controlled, HTN will also come down. F/u in 6 weks. The patient voiced understanding and agreement to the plan.  [place], DO 12/08/18  1:32 PM

## 2018-12-08 NOTE — Telephone Encounter (Signed)
Called left message to call back 

## 2018-12-08 NOTE — Telephone Encounter (Signed)
Called and scheduled her a Virtual Visit for 1 today 12/08/2018.

## 2018-12-09 DIAGNOSIS — N186 End stage renal disease: Secondary | ICD-10-CM | POA: Diagnosis not present

## 2018-12-09 DIAGNOSIS — N2581 Secondary hyperparathyroidism of renal origin: Secondary | ICD-10-CM | POA: Diagnosis not present

## 2018-12-11 DIAGNOSIS — N186 End stage renal disease: Secondary | ICD-10-CM | POA: Diagnosis not present

## 2018-12-11 DIAGNOSIS — N2581 Secondary hyperparathyroidism of renal origin: Secondary | ICD-10-CM | POA: Diagnosis not present

## 2018-12-14 DIAGNOSIS — N2581 Secondary hyperparathyroidism of renal origin: Secondary | ICD-10-CM | POA: Diagnosis not present

## 2018-12-14 DIAGNOSIS — N186 End stage renal disease: Secondary | ICD-10-CM | POA: Diagnosis not present

## 2018-12-16 ENCOUNTER — Ambulatory Visit: Payer: Self-pay | Admitting: *Deleted

## 2018-12-16 DIAGNOSIS — N2581 Secondary hyperparathyroidism of renal origin: Secondary | ICD-10-CM | POA: Diagnosis not present

## 2018-12-16 DIAGNOSIS — N186 End stage renal disease: Secondary | ICD-10-CM | POA: Diagnosis not present

## 2018-12-16 NOTE — Telephone Encounter (Addendum)
Pt called because per the nurse at dialysis, her b/p was in the 200 range. Pt can not remember what it was. The nurse advised her to go to the ED She said when she was driving home she felt tired in her chest. She sound weak over the phone. She had a hard time remembering her #. Advised to call 911 to be assessed. Did not triage any further. She stated that she would.  Will call back to check to see if they are coming.  Called patient back to see if EMS had been called and she stated she had called her daughter and she was coming to check her b/p with a monitor. Stressed to patient that she should be seen with her b/p being so high and the way she was sounding when speaking with her. She stated she will go to the hospital if it is still elevated. She felt like she just needed to rest at this time. Advised that I would let her provider know what was going on with her and what I advised. Pt voiced understanding. Notified LB at [facility] regarding this patient's symptoms. Routing to LB at [facility] for review and recommendation.

## 2018-12-18 DIAGNOSIS — N2581 Secondary hyperparathyroidism of renal origin: Secondary | ICD-10-CM | POA: Diagnosis not present

## 2018-12-18 DIAGNOSIS — N186 End stage renal disease: Secondary | ICD-10-CM | POA: Diagnosis not present

## 2018-12-23 DIAGNOSIS — N2581 Secondary hyperparathyroidism of renal origin: Secondary | ICD-10-CM | POA: Diagnosis not present

## 2018-12-23 DIAGNOSIS — N186 End stage renal disease: Secondary | ICD-10-CM | POA: Diagnosis not present

## 2018-12-25 DIAGNOSIS — N2581 Secondary hyperparathyroidism of renal origin: Secondary | ICD-10-CM | POA: Diagnosis not present

## 2018-12-25 DIAGNOSIS — N186 End stage renal disease: Secondary | ICD-10-CM | POA: Diagnosis not present

## 2018-12-27 DIAGNOSIS — N186 End stage renal disease: Secondary | ICD-10-CM | POA: Diagnosis not present

## 2018-12-27 DIAGNOSIS — Z992 Dependence on renal dialysis: Secondary | ICD-10-CM | POA: Diagnosis not present

## 2018-12-27 DIAGNOSIS — I129 Hypertensive chronic kidney disease with stage 1 through stage 4 chronic kidney disease, or unspecified chronic kidney disease: Secondary | ICD-10-CM | POA: Diagnosis not present

## 2018-12-30 DIAGNOSIS — D509 Iron deficiency anemia, unspecified: Secondary | ICD-10-CM | POA: Diagnosis not present

## 2018-12-30 DIAGNOSIS — N186 End stage renal disease: Secondary | ICD-10-CM | POA: Diagnosis not present

## 2018-12-30 DIAGNOSIS — D631 Anemia in chronic kidney disease: Secondary | ICD-10-CM | POA: Diagnosis not present

## 2018-12-30 DIAGNOSIS — N2581 Secondary hyperparathyroidism of renal origin: Secondary | ICD-10-CM | POA: Diagnosis not present

## 2019-01-01 DIAGNOSIS — N2581 Secondary hyperparathyroidism of renal origin: Secondary | ICD-10-CM | POA: Diagnosis not present

## 2019-01-01 DIAGNOSIS — N186 End stage renal disease: Secondary | ICD-10-CM | POA: Diagnosis not present

## 2019-01-01 DIAGNOSIS — D509 Iron deficiency anemia, unspecified: Secondary | ICD-10-CM | POA: Diagnosis not present

## 2019-01-01 DIAGNOSIS — D631 Anemia in chronic kidney disease: Secondary | ICD-10-CM | POA: Diagnosis not present

## 2019-01-04 DIAGNOSIS — D631 Anemia in chronic kidney disease: Secondary | ICD-10-CM | POA: Diagnosis not present

## 2019-01-04 DIAGNOSIS — N186 End stage renal disease: Secondary | ICD-10-CM | POA: Diagnosis not present

## 2019-01-04 DIAGNOSIS — D509 Iron deficiency anemia, unspecified: Secondary | ICD-10-CM | POA: Diagnosis not present

## 2019-01-04 DIAGNOSIS — N2581 Secondary hyperparathyroidism of renal origin: Secondary | ICD-10-CM | POA: Diagnosis not present

## 2019-01-06 DIAGNOSIS — D509 Iron deficiency anemia, unspecified: Secondary | ICD-10-CM | POA: Diagnosis not present

## 2019-01-06 DIAGNOSIS — N186 End stage renal disease: Secondary | ICD-10-CM | POA: Diagnosis not present

## 2019-01-06 DIAGNOSIS — D631 Anemia in chronic kidney disease: Secondary | ICD-10-CM | POA: Diagnosis not present

## 2019-01-06 DIAGNOSIS — N2581 Secondary hyperparathyroidism of renal origin: Secondary | ICD-10-CM | POA: Diagnosis not present

## 2019-01-08 DIAGNOSIS — D509 Iron deficiency anemia, unspecified: Secondary | ICD-10-CM | POA: Diagnosis not present

## 2019-01-08 DIAGNOSIS — N2581 Secondary hyperparathyroidism of renal origin: Secondary | ICD-10-CM | POA: Diagnosis not present

## 2019-01-08 DIAGNOSIS — D631 Anemia in chronic kidney disease: Secondary | ICD-10-CM | POA: Diagnosis not present

## 2019-01-08 DIAGNOSIS — N186 End stage renal disease: Secondary | ICD-10-CM | POA: Diagnosis not present

## 2019-01-13 DIAGNOSIS — N186 End stage renal disease: Secondary | ICD-10-CM | POA: Diagnosis not present

## 2019-01-13 DIAGNOSIS — D509 Iron deficiency anemia, unspecified: Secondary | ICD-10-CM | POA: Diagnosis not present

## 2019-01-13 DIAGNOSIS — D631 Anemia in chronic kidney disease: Secondary | ICD-10-CM | POA: Diagnosis not present

## 2019-01-13 DIAGNOSIS — N2581 Secondary hyperparathyroidism of renal origin: Secondary | ICD-10-CM | POA: Diagnosis not present

## 2019-01-18 DIAGNOSIS — D509 Iron deficiency anemia, unspecified: Secondary | ICD-10-CM | POA: Diagnosis not present

## 2019-01-18 DIAGNOSIS — N186 End stage renal disease: Secondary | ICD-10-CM | POA: Diagnosis not present

## 2019-01-18 DIAGNOSIS — D631 Anemia in chronic kidney disease: Secondary | ICD-10-CM | POA: Diagnosis not present

## 2019-01-18 DIAGNOSIS — N2581 Secondary hyperparathyroidism of renal origin: Secondary | ICD-10-CM | POA: Diagnosis not present

## 2019-01-22 DIAGNOSIS — N2581 Secondary hyperparathyroidism of renal origin: Secondary | ICD-10-CM | POA: Diagnosis not present

## 2019-01-22 DIAGNOSIS — D631 Anemia in chronic kidney disease: Secondary | ICD-10-CM | POA: Diagnosis not present

## 2019-01-22 DIAGNOSIS — D509 Iron deficiency anemia, unspecified: Secondary | ICD-10-CM | POA: Diagnosis not present

## 2019-01-22 DIAGNOSIS — N186 End stage renal disease: Secondary | ICD-10-CM | POA: Diagnosis not present

## 2019-01-25 DIAGNOSIS — D631 Anemia in chronic kidney disease: Secondary | ICD-10-CM | POA: Diagnosis not present

## 2019-01-25 DIAGNOSIS — N186 End stage renal disease: Secondary | ICD-10-CM | POA: Diagnosis not present

## 2019-01-25 DIAGNOSIS — N2581 Secondary hyperparathyroidism of renal origin: Secondary | ICD-10-CM | POA: Diagnosis not present

## 2019-01-25 DIAGNOSIS — D509 Iron deficiency anemia, unspecified: Secondary | ICD-10-CM | POA: Diagnosis not present

## 2019-01-27 DIAGNOSIS — N2581 Secondary hyperparathyroidism of renal origin: Secondary | ICD-10-CM | POA: Diagnosis not present

## 2019-01-27 DIAGNOSIS — D509 Iron deficiency anemia, unspecified: Secondary | ICD-10-CM | POA: Diagnosis not present

## 2019-01-27 DIAGNOSIS — N186 End stage renal disease: Secondary | ICD-10-CM | POA: Diagnosis not present

## 2019-01-27 DIAGNOSIS — D631 Anemia in chronic kidney disease: Secondary | ICD-10-CM | POA: Diagnosis not present

## 2019-01-29 DIAGNOSIS — N186 End stage renal disease: Secondary | ICD-10-CM | POA: Diagnosis not present

## 2019-01-29 DIAGNOSIS — D509 Iron deficiency anemia, unspecified: Secondary | ICD-10-CM | POA: Diagnosis not present

## 2019-01-29 DIAGNOSIS — D631 Anemia in chronic kidney disease: Secondary | ICD-10-CM | POA: Diagnosis not present

## 2019-01-29 DIAGNOSIS — N2581 Secondary hyperparathyroidism of renal origin: Secondary | ICD-10-CM | POA: Diagnosis not present

## 2019-01-30 DIAGNOSIS — I10 Essential (primary) hypertension: Secondary | ICD-10-CM | POA: Diagnosis not present

## 2019-01-30 DIAGNOSIS — F17200 Nicotine dependence, unspecified, uncomplicated: Secondary | ICD-10-CM | POA: Diagnosis not present

## 2019-01-30 DIAGNOSIS — M329 Systemic lupus erythematosus, unspecified: Secondary | ICD-10-CM | POA: Diagnosis not present

## 2019-01-30 DIAGNOSIS — Z79899 Other long term (current) drug therapy: Secondary | ICD-10-CM | POA: Diagnosis not present

## 2019-01-30 DIAGNOSIS — M79604 Pain in right leg: Secondary | ICD-10-CM | POA: Diagnosis not present

## 2019-01-30 DIAGNOSIS — M5441 Lumbago with sciatica, right side: Secondary | ICD-10-CM | POA: Diagnosis not present

## 2019-01-30 DIAGNOSIS — N289 Disorder of kidney and ureter, unspecified: Secondary | ICD-10-CM | POA: Diagnosis not present

## 2019-02-01 ENCOUNTER — Telehealth: Payer: Self-pay | Admitting: Family Medicine

## 2019-02-01 DIAGNOSIS — N186 End stage renal disease: Secondary | ICD-10-CM | POA: Diagnosis not present

## 2019-02-01 DIAGNOSIS — D631 Anemia in chronic kidney disease: Secondary | ICD-10-CM | POA: Diagnosis not present

## 2019-02-01 DIAGNOSIS — D509 Iron deficiency anemia, unspecified: Secondary | ICD-10-CM | POA: Diagnosis not present

## 2019-02-01 DIAGNOSIS — N2581 Secondary hyperparathyroidism of renal origin: Secondary | ICD-10-CM | POA: Diagnosis not present

## 2019-02-01 NOTE — Telephone Encounter (Signed)
Sched appt. Ty.

## 2019-02-01 NOTE — Telephone Encounter (Signed)
Patient called on 01/30/2019 regarding right groin pain and going down right leg/lower back.  RN documented patient was in pain and has taken pain medication for the day. Called to followup on message//left message to call back.

## 2019-02-01 NOTE — Telephone Encounter (Signed)
Called the patient left message to call back 

## 2019-02-02 NOTE — Telephone Encounter (Signed)
Called and she is doing better went to the ED. Schedule ED followup with her on 02/09/2019.

## 2019-02-03 DIAGNOSIS — N186 End stage renal disease: Secondary | ICD-10-CM | POA: Diagnosis not present

## 2019-02-03 DIAGNOSIS — D631 Anemia in chronic kidney disease: Secondary | ICD-10-CM | POA: Diagnosis not present

## 2019-02-03 DIAGNOSIS — D509 Iron deficiency anemia, unspecified: Secondary | ICD-10-CM | POA: Diagnosis not present

## 2019-02-03 DIAGNOSIS — N2581 Secondary hyperparathyroidism of renal origin: Secondary | ICD-10-CM | POA: Diagnosis not present

## 2019-02-05 DIAGNOSIS — N186 End stage renal disease: Secondary | ICD-10-CM | POA: Diagnosis not present

## 2019-02-05 DIAGNOSIS — D509 Iron deficiency anemia, unspecified: Secondary | ICD-10-CM | POA: Diagnosis not present

## 2019-02-05 DIAGNOSIS — N2581 Secondary hyperparathyroidism of renal origin: Secondary | ICD-10-CM | POA: Diagnosis not present

## 2019-02-05 DIAGNOSIS — D631 Anemia in chronic kidney disease: Secondary | ICD-10-CM | POA: Diagnosis not present

## 2019-02-09 ENCOUNTER — Inpatient Hospital Stay: Payer: MEDICARE | Admitting: Family Medicine

## 2019-02-12 DIAGNOSIS — D509 Iron deficiency anemia, unspecified: Secondary | ICD-10-CM | POA: Diagnosis not present

## 2019-02-12 DIAGNOSIS — D631 Anemia in chronic kidney disease: Secondary | ICD-10-CM | POA: Diagnosis not present

## 2019-02-12 DIAGNOSIS — N2581 Secondary hyperparathyroidism of renal origin: Secondary | ICD-10-CM | POA: Diagnosis not present

## 2019-02-12 DIAGNOSIS — N186 End stage renal disease: Secondary | ICD-10-CM | POA: Diagnosis not present

## 2019-02-15 DIAGNOSIS — D631 Anemia in chronic kidney disease: Secondary | ICD-10-CM | POA: Diagnosis not present

## 2019-02-15 DIAGNOSIS — N2581 Secondary hyperparathyroidism of renal origin: Secondary | ICD-10-CM | POA: Diagnosis not present

## 2019-02-15 DIAGNOSIS — N186 End stage renal disease: Secondary | ICD-10-CM | POA: Diagnosis not present

## 2019-02-15 DIAGNOSIS — D509 Iron deficiency anemia, unspecified: Secondary | ICD-10-CM | POA: Diagnosis not present

## 2019-02-17 DIAGNOSIS — N186 End stage renal disease: Secondary | ICD-10-CM | POA: Diagnosis not present

## 2019-02-17 DIAGNOSIS — N2581 Secondary hyperparathyroidism of renal origin: Secondary | ICD-10-CM | POA: Diagnosis not present

## 2019-02-17 DIAGNOSIS — D631 Anemia in chronic kidney disease: Secondary | ICD-10-CM | POA: Diagnosis not present

## 2019-02-17 DIAGNOSIS — D509 Iron deficiency anemia, unspecified: Secondary | ICD-10-CM | POA: Diagnosis not present

## 2019-02-19 DIAGNOSIS — D509 Iron deficiency anemia, unspecified: Secondary | ICD-10-CM | POA: Diagnosis not present

## 2019-02-19 DIAGNOSIS — N186 End stage renal disease: Secondary | ICD-10-CM | POA: Diagnosis not present

## 2019-02-19 DIAGNOSIS — N2581 Secondary hyperparathyroidism of renal origin: Secondary | ICD-10-CM | POA: Diagnosis not present

## 2019-02-19 DIAGNOSIS — D631 Anemia in chronic kidney disease: Secondary | ICD-10-CM | POA: Diagnosis not present

## 2019-02-24 DIAGNOSIS — D509 Iron deficiency anemia, unspecified: Secondary | ICD-10-CM | POA: Diagnosis not present

## 2019-02-24 DIAGNOSIS — N186 End stage renal disease: Secondary | ICD-10-CM | POA: Diagnosis not present

## 2019-02-24 DIAGNOSIS — N2581 Secondary hyperparathyroidism of renal origin: Secondary | ICD-10-CM | POA: Diagnosis not present

## 2019-02-24 DIAGNOSIS — D631 Anemia in chronic kidney disease: Secondary | ICD-10-CM | POA: Diagnosis not present

## 2019-02-26 DIAGNOSIS — D631 Anemia in chronic kidney disease: Secondary | ICD-10-CM | POA: Diagnosis not present

## 2019-02-26 DIAGNOSIS — N2581 Secondary hyperparathyroidism of renal origin: Secondary | ICD-10-CM | POA: Diagnosis not present

## 2019-02-26 DIAGNOSIS — N186 End stage renal disease: Secondary | ICD-10-CM | POA: Diagnosis not present

## 2019-02-26 DIAGNOSIS — D509 Iron deficiency anemia, unspecified: Secondary | ICD-10-CM | POA: Diagnosis not present

## 2019-03-01 DIAGNOSIS — N2581 Secondary hyperparathyroidism of renal origin: Secondary | ICD-10-CM | POA: Diagnosis not present

## 2019-03-01 DIAGNOSIS — D509 Iron deficiency anemia, unspecified: Secondary | ICD-10-CM | POA: Diagnosis not present

## 2019-03-01 DIAGNOSIS — D631 Anemia in chronic kidney disease: Secondary | ICD-10-CM | POA: Diagnosis not present

## 2019-03-01 DIAGNOSIS — N186 End stage renal disease: Secondary | ICD-10-CM | POA: Diagnosis not present

## 2019-03-05 DIAGNOSIS — N2581 Secondary hyperparathyroidism of renal origin: Secondary | ICD-10-CM | POA: Diagnosis not present

## 2019-03-05 DIAGNOSIS — N186 End stage renal disease: Secondary | ICD-10-CM | POA: Diagnosis not present

## 2019-03-05 DIAGNOSIS — D509 Iron deficiency anemia, unspecified: Secondary | ICD-10-CM | POA: Diagnosis not present

## 2019-03-05 DIAGNOSIS — D631 Anemia in chronic kidney disease: Secondary | ICD-10-CM | POA: Diagnosis not present

## 2019-03-08 DIAGNOSIS — N2581 Secondary hyperparathyroidism of renal origin: Secondary | ICD-10-CM | POA: Diagnosis not present

## 2019-03-08 DIAGNOSIS — D631 Anemia in chronic kidney disease: Secondary | ICD-10-CM | POA: Diagnosis not present

## 2019-03-08 DIAGNOSIS — D509 Iron deficiency anemia, unspecified: Secondary | ICD-10-CM | POA: Diagnosis not present

## 2019-03-08 DIAGNOSIS — N186 End stage renal disease: Secondary | ICD-10-CM | POA: Diagnosis not present

## 2019-03-10 DIAGNOSIS — N186 End stage renal disease: Secondary | ICD-10-CM | POA: Diagnosis not present

## 2019-03-10 DIAGNOSIS — N2581 Secondary hyperparathyroidism of renal origin: Secondary | ICD-10-CM | POA: Diagnosis not present

## 2019-03-10 DIAGNOSIS — D631 Anemia in chronic kidney disease: Secondary | ICD-10-CM | POA: Diagnosis not present

## 2019-03-10 DIAGNOSIS — D509 Iron deficiency anemia, unspecified: Secondary | ICD-10-CM | POA: Diagnosis not present

## 2019-03-19 DIAGNOSIS — N186 End stage renal disease: Secondary | ICD-10-CM | POA: Diagnosis not present

## 2019-03-19 DIAGNOSIS — D631 Anemia in chronic kidney disease: Secondary | ICD-10-CM | POA: Diagnosis not present

## 2019-03-19 DIAGNOSIS — N2581 Secondary hyperparathyroidism of renal origin: Secondary | ICD-10-CM | POA: Diagnosis not present

## 2019-03-19 DIAGNOSIS — D509 Iron deficiency anemia, unspecified: Secondary | ICD-10-CM | POA: Diagnosis not present

## 2019-03-22 DIAGNOSIS — N2581 Secondary hyperparathyroidism of renal origin: Secondary | ICD-10-CM | POA: Diagnosis not present

## 2019-03-22 DIAGNOSIS — N186 End stage renal disease: Secondary | ICD-10-CM | POA: Diagnosis not present

## 2019-03-22 DIAGNOSIS — D509 Iron deficiency anemia, unspecified: Secondary | ICD-10-CM | POA: Diagnosis not present

## 2019-03-22 DIAGNOSIS — D631 Anemia in chronic kidney disease: Secondary | ICD-10-CM | POA: Diagnosis not present

## 2019-03-24 DIAGNOSIS — D631 Anemia in chronic kidney disease: Secondary | ICD-10-CM | POA: Diagnosis not present

## 2019-03-24 DIAGNOSIS — N2581 Secondary hyperparathyroidism of renal origin: Secondary | ICD-10-CM | POA: Diagnosis not present

## 2019-03-24 DIAGNOSIS — N186 End stage renal disease: Secondary | ICD-10-CM | POA: Diagnosis not present

## 2019-03-24 DIAGNOSIS — D509 Iron deficiency anemia, unspecified: Secondary | ICD-10-CM | POA: Diagnosis not present

## 2019-03-29 DIAGNOSIS — N186 End stage renal disease: Secondary | ICD-10-CM | POA: Diagnosis not present

## 2019-03-29 DIAGNOSIS — D631 Anemia in chronic kidney disease: Secondary | ICD-10-CM | POA: Diagnosis not present

## 2019-03-29 DIAGNOSIS — D509 Iron deficiency anemia, unspecified: Secondary | ICD-10-CM | POA: Diagnosis not present

## 2019-03-29 DIAGNOSIS — N2581 Secondary hyperparathyroidism of renal origin: Secondary | ICD-10-CM | POA: Diagnosis not present

## 2019-03-31 DIAGNOSIS — N2581 Secondary hyperparathyroidism of renal origin: Secondary | ICD-10-CM | POA: Diagnosis not present

## 2019-03-31 DIAGNOSIS — N186 End stage renal disease: Secondary | ICD-10-CM | POA: Diagnosis not present

## 2019-03-31 DIAGNOSIS — D509 Iron deficiency anemia, unspecified: Secondary | ICD-10-CM | POA: Diagnosis not present

## 2019-04-02 DIAGNOSIS — D509 Iron deficiency anemia, unspecified: Secondary | ICD-10-CM | POA: Diagnosis not present

## 2019-04-02 DIAGNOSIS — N186 End stage renal disease: Secondary | ICD-10-CM | POA: Diagnosis not present

## 2019-04-02 DIAGNOSIS — N2581 Secondary hyperparathyroidism of renal origin: Secondary | ICD-10-CM | POA: Diagnosis not present

## 2019-04-05 DIAGNOSIS — N186 End stage renal disease: Secondary | ICD-10-CM | POA: Diagnosis not present

## 2019-04-05 DIAGNOSIS — N2581 Secondary hyperparathyroidism of renal origin: Secondary | ICD-10-CM | POA: Diagnosis not present

## 2019-04-05 DIAGNOSIS — D509 Iron deficiency anemia, unspecified: Secondary | ICD-10-CM | POA: Diagnosis not present

## 2019-04-07 DIAGNOSIS — N2581 Secondary hyperparathyroidism of renal origin: Secondary | ICD-10-CM | POA: Diagnosis not present

## 2019-04-07 DIAGNOSIS — N186 End stage renal disease: Secondary | ICD-10-CM | POA: Diagnosis not present

## 2019-04-07 DIAGNOSIS — D509 Iron deficiency anemia, unspecified: Secondary | ICD-10-CM | POA: Diagnosis not present

## 2019-04-12 DIAGNOSIS — N2581 Secondary hyperparathyroidism of renal origin: Secondary | ICD-10-CM | POA: Diagnosis not present

## 2019-04-12 DIAGNOSIS — D509 Iron deficiency anemia, unspecified: Secondary | ICD-10-CM | POA: Diagnosis not present

## 2019-04-12 DIAGNOSIS — N186 End stage renal disease: Secondary | ICD-10-CM | POA: Diagnosis not present

## 2019-04-14 DIAGNOSIS — D509 Iron deficiency anemia, unspecified: Secondary | ICD-10-CM | POA: Diagnosis not present

## 2019-04-14 DIAGNOSIS — N2581 Secondary hyperparathyroidism of renal origin: Secondary | ICD-10-CM | POA: Diagnosis not present

## 2019-04-14 DIAGNOSIS — N186 End stage renal disease: Secondary | ICD-10-CM | POA: Diagnosis not present

## 2019-04-16 DIAGNOSIS — N186 End stage renal disease: Secondary | ICD-10-CM | POA: Diagnosis not present

## 2019-04-16 DIAGNOSIS — D509 Iron deficiency anemia, unspecified: Secondary | ICD-10-CM | POA: Diagnosis not present

## 2019-04-16 DIAGNOSIS — N2581 Secondary hyperparathyroidism of renal origin: Secondary | ICD-10-CM | POA: Diagnosis not present

## 2019-04-19 DIAGNOSIS — N186 End stage renal disease: Secondary | ICD-10-CM | POA: Diagnosis not present

## 2019-04-19 DIAGNOSIS — N2581 Secondary hyperparathyroidism of renal origin: Secondary | ICD-10-CM | POA: Diagnosis not present

## 2019-04-19 DIAGNOSIS — D509 Iron deficiency anemia, unspecified: Secondary | ICD-10-CM | POA: Diagnosis not present

## 2019-04-20 ENCOUNTER — Other Ambulatory Visit: Payer: Self-pay | Admitting: Family Medicine

## 2019-04-20 DIAGNOSIS — F418 Other specified anxiety disorders: Secondary | ICD-10-CM

## 2019-04-21 DIAGNOSIS — D509 Iron deficiency anemia, unspecified: Secondary | ICD-10-CM | POA: Diagnosis not present

## 2019-04-21 DIAGNOSIS — N2581 Secondary hyperparathyroidism of renal origin: Secondary | ICD-10-CM | POA: Diagnosis not present

## 2019-04-21 DIAGNOSIS — N186 End stage renal disease: Secondary | ICD-10-CM | POA: Diagnosis not present

## 2019-04-26 DIAGNOSIS — N186 End stage renal disease: Secondary | ICD-10-CM | POA: Diagnosis not present

## 2019-04-26 DIAGNOSIS — N2581 Secondary hyperparathyroidism of renal origin: Secondary | ICD-10-CM | POA: Diagnosis not present

## 2019-04-26 DIAGNOSIS — D509 Iron deficiency anemia, unspecified: Secondary | ICD-10-CM | POA: Diagnosis not present

## 2019-04-28 DIAGNOSIS — N186 End stage renal disease: Secondary | ICD-10-CM | POA: Diagnosis not present

## 2019-04-28 DIAGNOSIS — D509 Iron deficiency anemia, unspecified: Secondary | ICD-10-CM | POA: Diagnosis not present

## 2019-04-28 DIAGNOSIS — N2581 Secondary hyperparathyroidism of renal origin: Secondary | ICD-10-CM | POA: Diagnosis not present

## 2019-04-30 DIAGNOSIS — D631 Anemia in chronic kidney disease: Secondary | ICD-10-CM | POA: Diagnosis not present

## 2019-04-30 DIAGNOSIS — N186 End stage renal disease: Secondary | ICD-10-CM | POA: Diagnosis not present

## 2019-04-30 DIAGNOSIS — D509 Iron deficiency anemia, unspecified: Secondary | ICD-10-CM | POA: Diagnosis not present

## 2019-04-30 DIAGNOSIS — N2581 Secondary hyperparathyroidism of renal origin: Secondary | ICD-10-CM | POA: Diagnosis not present

## 2019-05-04 ENCOUNTER — Ambulatory Visit: Payer: MEDICARE | Admitting: Family Medicine

## 2019-05-06 ENCOUNTER — Other Ambulatory Visit: Payer: Self-pay | Admitting: Family Medicine

## 2019-05-06 DIAGNOSIS — D631 Anemia in chronic kidney disease: Secondary | ICD-10-CM | POA: Diagnosis not present

## 2019-05-06 DIAGNOSIS — N2581 Secondary hyperparathyroidism of renal origin: Secondary | ICD-10-CM | POA: Diagnosis not present

## 2019-05-06 DIAGNOSIS — D509 Iron deficiency anemia, unspecified: Secondary | ICD-10-CM | POA: Diagnosis not present

## 2019-05-06 DIAGNOSIS — N186 End stage renal disease: Secondary | ICD-10-CM | POA: Diagnosis not present

## 2019-05-06 DIAGNOSIS — M329 Systemic lupus erythematosus, unspecified: Secondary | ICD-10-CM

## 2019-05-06 NOTE — Telephone Encounter (Signed)
Medication Refill - Medication: hydroxychloroquine (PLAQUENIL) 200 MG tablet    Has the patient contacted their pharmacy? Yes.   (Agent: If no, request that the patient contact the pharmacy for the refill.) (Agent: If yes, when and what did the pharmacy advise?)  Preferred Pharmacy (with phone number or street name):  [facility] - [place], [place] - [place]  [address] [place] [address]  Phone: [phone] Fax: [phone]     Agent: Please be advised that RX refills may take up to 3 business days. We ask that you follow-up with your pharmacy.

## 2019-05-06 NOTE — Telephone Encounter (Signed)
Refill request  for hydroxychloroquine 200 mg.  Prescription expired on 04/13/19.

## 2019-05-07 DIAGNOSIS — N2581 Secondary hyperparathyroidism of renal origin: Secondary | ICD-10-CM | POA: Diagnosis not present

## 2019-05-07 DIAGNOSIS — N186 End stage renal disease: Secondary | ICD-10-CM | POA: Diagnosis not present

## 2019-05-07 DIAGNOSIS — D631 Anemia in chronic kidney disease: Secondary | ICD-10-CM | POA: Diagnosis not present

## 2019-05-07 DIAGNOSIS — D509 Iron deficiency anemia, unspecified: Secondary | ICD-10-CM | POA: Diagnosis not present

## 2019-05-07 MED ORDER — HYDROXYCHLOROQUINE SULFATE 200 MG PO TABS: 200.0000 mg | ORAL_TABLET | Freq: Two times a day (BID) | ORAL | 5 refills | Status: DC

## 2019-05-10 DIAGNOSIS — D631 Anemia in chronic kidney disease: Secondary | ICD-10-CM | POA: Diagnosis not present

## 2019-05-10 DIAGNOSIS — N2581 Secondary hyperparathyroidism of renal origin: Secondary | ICD-10-CM | POA: Diagnosis not present

## 2019-05-10 DIAGNOSIS — D509 Iron deficiency anemia, unspecified: Secondary | ICD-10-CM | POA: Diagnosis not present

## 2019-05-10 DIAGNOSIS — N186 End stage renal disease: Secondary | ICD-10-CM | POA: Diagnosis not present

## 2019-05-11 ENCOUNTER — Other Ambulatory Visit: Payer: Self-pay

## 2019-05-11 ENCOUNTER — Encounter: Payer: Self-pay | Admitting: Family Medicine

## 2019-05-11 ENCOUNTER — Ambulatory Visit (INDEPENDENT_AMBULATORY_CARE_PROVIDER_SITE_OTHER): Payer: MEDICARE | Admitting: Family Medicine

## 2019-05-11 VITALS — BP 182/82 | HR 87 | Temp 98.1°F | Ht 66.0 in | Wt 170.1 lb

## 2019-05-11 DIAGNOSIS — L84 Corns and callosities: Secondary | ICD-10-CM

## 2019-05-11 DIAGNOSIS — F418 Other specified anxiety disorders: Secondary | ICD-10-CM

## 2019-05-11 DIAGNOSIS — I10 Essential (primary) hypertension: Secondary | ICD-10-CM

## 2019-05-11 DIAGNOSIS — M329 Systemic lupus erythematosus, unspecified: Secondary | ICD-10-CM | POA: Diagnosis not present

## 2019-05-11 DIAGNOSIS — B353 Tinea pedis: Secondary | ICD-10-CM

## 2019-05-11 MED ORDER — KETOCONAZOLE 2 % EX CREA: 1.0000 "application " | TOPICAL_CREAM | Freq: Every day | CUTANEOUS | 0 refills | Status: DC

## 2019-05-11 MED ORDER — HYDROXYCHLOROQUINE SULFATE 200 MG PO TABS: 200.0000 mg | ORAL_TABLET | Freq: Two times a day (BID) | ORAL | 5 refills | Status: DC

## 2019-05-11 MED ORDER — METOPROLOL SUCCINATE ER 100 MG PO TB24: 100.0000 mg | ORAL_TABLET | Freq: Every day | ORAL | 3 refills | Status: DC

## 2019-05-11 NOTE — Patient Instructions (Addendum)
Keep the diet clean and stay active.  Check your blood pressure at home 3 times weekly and write down the readings.  Bring your blood pressure monitor to your next appointment.   Follow up with Dr Amalia Hailey about your foot. We have sent in a cream for athlete's foot.  Plaquenil has been refilled for your Lupus.   Let us know if you need anything.

## 2019-05-11 NOTE — Progress Notes (Signed)
Chief Complaint  Patient presents with  . Follow-up  . Hypertension    Subjective Karen Carr is a 63 y.o. female who presents for hypertension follow up. She does not routinely monitor home blood pressures. She is compliant with medications- Norvasc 10 mg/d, hydralazine 25 mg tid,  Patient has these side effects of medication: none She is adhering to a healthy diet overall. Current exercise: none  Was started on Zoloft 50 mg/d around 5 mo ago. Works quite well. Trying to make time for herself. Her BP is stressing her out. Her husband's poor health is contributing to her stress.  No suicidal or homicidal ideation.  Her memory appears to be worse during this time as well.  She has skin lesions on the bottom of her feet.  She sees podiatry for this issue.  She also has a history of athlete's foot and it is flaring again.  Patient has a history of lupus.  She currently takes Plaquenil 200 mg daily.  She has not followed up routinely here and has run out.  She needs a refill.  She was tolerated the medicine well and having no side effects.  She was referred to the rheumatology team around 1 year ago and it is unclear if she ever went.   Past Medical History:  Diagnosis Date  . ESRD (end stage renal disease) ([place])   . Gout   . Hyperlipidemia   . Hypertension   . Major depressive disorder   . SLE (systemic lupus erythematosus) ([place])   . Vitamin D deficiency     Review of Systems Cardiovascular: no chest pain Respiratory:  no shortness of breath Psych: No homicidal or suicidal ideation Skin: + Lesions on feet Constitutional: No fevers Endo: No weight gain Eyes: No vision changes GU: No dysuria GI: No diarrhea MSK: No jt pain  Exam BP (!) 182/82 (BP Location: Right Arm, Patient Position: Sitting, Cuff Size: Normal)   Pulse 87   Temp 98.1 F (36.7 C) (Temporal)   Ht 5\' 6"  (1.676 m)   Wt 170 lb 2 oz (77.2 kg)   SpO2 95%   BMI 27.46 kg/m  General:  well developed, well  nourished, in no apparent distress Heart: RRR, no bruits, no LE edema Lungs: clear to auscultation, no accessory muscle use Psych: well oriented with normal range of affect and appropriate judgment/insight  Essential hypertension - Plan: metoprolol succinate (TOPROL-XL) 100 MG 24 hr tablet  Situational anxiety  Systemic lupus erythematosus, unspecified SLE type, unspecified organ involvement status ([place]) - Plan: hydroxychloroquine (PLAQUENIL) 200 MG tablet  Corn of foot  Tinea pedis of both feet - Plan: ketoconazole (NIZORAL) 2 % cream  1-continue medications, increase dose of metoprolol. Counseled on diet and exercise. Start monitoring blood pressures at home.  I will see her in 1 week for this.  Could increase metoprolol or increase lisinopril as this is dialyzable. 2-continue Zoloft.  Counseled on exercise.  Sounds like memory issues are related to stress.  Offered to change doses but she declined. 3-refill Plaquenil.  Need to make sure she made it into her specialists for rheumatology and ophthalmology respectively. 4- F/u with Podiatry 5- Sent in [facility] for 6 weeks The patient voiced understanding and agreement to the plan.  [place], DO 05/11/19  11:09 AM

## 2019-05-12 DIAGNOSIS — D631 Anemia in chronic kidney disease: Secondary | ICD-10-CM | POA: Diagnosis not present

## 2019-05-12 DIAGNOSIS — D509 Iron deficiency anemia, unspecified: Secondary | ICD-10-CM | POA: Diagnosis not present

## 2019-05-12 DIAGNOSIS — N2581 Secondary hyperparathyroidism of renal origin: Secondary | ICD-10-CM | POA: Diagnosis not present

## 2019-05-12 DIAGNOSIS — N186 End stage renal disease: Secondary | ICD-10-CM | POA: Diagnosis not present

## 2019-05-14 DIAGNOSIS — D509 Iron deficiency anemia, unspecified: Secondary | ICD-10-CM | POA: Diagnosis not present

## 2019-05-14 DIAGNOSIS — N2581 Secondary hyperparathyroidism of renal origin: Secondary | ICD-10-CM | POA: Diagnosis not present

## 2019-05-14 DIAGNOSIS — N186 End stage renal disease: Secondary | ICD-10-CM | POA: Diagnosis not present

## 2019-05-14 DIAGNOSIS — D631 Anemia in chronic kidney disease: Secondary | ICD-10-CM | POA: Diagnosis not present

## 2019-05-17 DIAGNOSIS — N186 End stage renal disease: Secondary | ICD-10-CM | POA: Diagnosis not present

## 2019-05-17 DIAGNOSIS — N2581 Secondary hyperparathyroidism of renal origin: Secondary | ICD-10-CM | POA: Diagnosis not present

## 2019-05-17 DIAGNOSIS — D631 Anemia in chronic kidney disease: Secondary | ICD-10-CM | POA: Diagnosis not present

## 2019-05-17 DIAGNOSIS — D509 Iron deficiency anemia, unspecified: Secondary | ICD-10-CM | POA: Diagnosis not present

## 2019-05-18 ENCOUNTER — Ambulatory Visit: Payer: MEDICARE | Admitting: Family Medicine

## 2019-05-20 DIAGNOSIS — E663 Overweight: Secondary | ICD-10-CM | POA: Diagnosis not present

## 2019-05-20 DIAGNOSIS — Z6827 Body mass index (BMI) 27.0-27.9, adult: Secondary | ICD-10-CM | POA: Diagnosis not present

## 2019-05-20 DIAGNOSIS — M25551 Pain in right hip: Secondary | ICD-10-CM | POA: Diagnosis not present

## 2019-05-20 DIAGNOSIS — M329 Systemic lupus erythematosus, unspecified: Secondary | ICD-10-CM | POA: Diagnosis not present

## 2019-05-21 DIAGNOSIS — N186 End stage renal disease: Secondary | ICD-10-CM | POA: Diagnosis not present

## 2019-05-21 DIAGNOSIS — N2581 Secondary hyperparathyroidism of renal origin: Secondary | ICD-10-CM | POA: Diagnosis not present

## 2019-05-21 DIAGNOSIS — D631 Anemia in chronic kidney disease: Secondary | ICD-10-CM | POA: Diagnosis not present

## 2019-05-21 DIAGNOSIS — D509 Iron deficiency anemia, unspecified: Secondary | ICD-10-CM | POA: Diagnosis not present

## 2019-05-22 ENCOUNTER — Other Ambulatory Visit: Payer: Self-pay | Admitting: Family Medicine

## 2019-05-22 DIAGNOSIS — F418 Other specified anxiety disorders: Secondary | ICD-10-CM

## 2019-05-24 DIAGNOSIS — N186 End stage renal disease: Secondary | ICD-10-CM | POA: Diagnosis not present

## 2019-05-24 DIAGNOSIS — D631 Anemia in chronic kidney disease: Secondary | ICD-10-CM | POA: Diagnosis not present

## 2019-05-24 DIAGNOSIS — D509 Iron deficiency anemia, unspecified: Secondary | ICD-10-CM | POA: Diagnosis not present

## 2019-05-24 DIAGNOSIS — N2581 Secondary hyperparathyroidism of renal origin: Secondary | ICD-10-CM | POA: Diagnosis not present

## 2019-05-28 DIAGNOSIS — N186 End stage renal disease: Secondary | ICD-10-CM | POA: Diagnosis not present

## 2019-05-28 DIAGNOSIS — N2581 Secondary hyperparathyroidism of renal origin: Secondary | ICD-10-CM | POA: Diagnosis not present

## 2019-05-28 DIAGNOSIS — D509 Iron deficiency anemia, unspecified: Secondary | ICD-10-CM | POA: Diagnosis not present

## 2019-05-28 DIAGNOSIS — D631 Anemia in chronic kidney disease: Secondary | ICD-10-CM | POA: Diagnosis not present

## 2019-06-02 DIAGNOSIS — N186 End stage renal disease: Secondary | ICD-10-CM | POA: Diagnosis not present

## 2019-06-02 DIAGNOSIS — D631 Anemia in chronic kidney disease: Secondary | ICD-10-CM | POA: Diagnosis not present

## 2019-06-02 DIAGNOSIS — N2581 Secondary hyperparathyroidism of renal origin: Secondary | ICD-10-CM | POA: Diagnosis not present

## 2019-06-02 DIAGNOSIS — D509 Iron deficiency anemia, unspecified: Secondary | ICD-10-CM | POA: Diagnosis not present

## 2019-06-04 DIAGNOSIS — N2581 Secondary hyperparathyroidism of renal origin: Secondary | ICD-10-CM | POA: Diagnosis not present

## 2019-06-04 DIAGNOSIS — N186 End stage renal disease: Secondary | ICD-10-CM | POA: Diagnosis not present

## 2019-06-04 DIAGNOSIS — D509 Iron deficiency anemia, unspecified: Secondary | ICD-10-CM | POA: Diagnosis not present

## 2019-06-04 DIAGNOSIS — D631 Anemia in chronic kidney disease: Secondary | ICD-10-CM | POA: Diagnosis not present

## 2019-06-07 DIAGNOSIS — D509 Iron deficiency anemia, unspecified: Secondary | ICD-10-CM | POA: Diagnosis not present

## 2019-06-07 DIAGNOSIS — D631 Anemia in chronic kidney disease: Secondary | ICD-10-CM | POA: Diagnosis not present

## 2019-06-07 DIAGNOSIS — N2581 Secondary hyperparathyroidism of renal origin: Secondary | ICD-10-CM | POA: Diagnosis not present

## 2019-06-07 DIAGNOSIS — N186 End stage renal disease: Secondary | ICD-10-CM | POA: Diagnosis not present

## 2019-06-08 ENCOUNTER — Encounter: Payer: Self-pay | Admitting: Family Medicine

## 2019-06-08 ENCOUNTER — Other Ambulatory Visit: Payer: Self-pay

## 2019-06-08 ENCOUNTER — Telehealth: Payer: Self-pay | Admitting: *Deleted

## 2019-06-08 ENCOUNTER — Ambulatory Visit (INDEPENDENT_AMBULATORY_CARE_PROVIDER_SITE_OTHER): Payer: MEDICARE | Admitting: Family Medicine

## 2019-06-08 VITALS — BP 164/90 | HR 68 | Temp 97.1°F | Ht 66.0 in | Wt 169.0 lb

## 2019-06-08 DIAGNOSIS — M67431 Ganglion, right wrist: Secondary | ICD-10-CM | POA: Diagnosis not present

## 2019-06-08 DIAGNOSIS — I10 Essential (primary) hypertension: Secondary | ICD-10-CM | POA: Diagnosis not present

## 2019-06-08 NOTE — Patient Instructions (Addendum)
Check your blood pressure at home. Alternate checking in the morning, afternoon and evening.  Goal weight: 160-165 lbs unless kidney team recommends something else.  Keep the diet clean and stay active.  Please have your kidney team send your lab results to our office please.   Let us know if you need anything.  Ganglion Cyst  A ganglion cyst is a non-cancerous, fluid-filled lump that occurs near a joint or tendon. The cyst grows out of a joint or the lining of a tendon. Ganglion cysts most often develop in the hand or wrist, but they can also develop in the shoulder, elbow, hip, knee, ankle, or foot. Ganglion cysts are ball-shaped or egg-shaped. Their size can range from the size of a pea to larger than a grape. Increased activity may cause the cyst to get bigger because more fluid starts to build up. What are the causes? The exact cause of this condition is not known, but it may be related to:  Inflammation or irritation around the joint.  An injury.  Repetitive movements or overuse.  Arthritis. What increases the risk? You are more likely to develop this condition if:  You are a woman.  You are 37-34 years old. What are the signs or symptoms? The main symptom of this condition is a lump. It most often appears on the hand or wrist. In many cases, there are no other symptoms, but a cyst can sometimes cause:  Tingling.  Pain.  Numbness.  Muscle weakness.  Weak grip.  Less range of motion in a joint. How is this diagnosed? Ganglion cysts are usually diagnosed based on a physical exam. Your health care provider will feel the lump and may shine a light next to it. If it is a ganglion cyst, the light will likely shine through it. Your health care provider may order an X-ray, ultrasound, or MRI to rule out other conditions. How is this treated? Ganglion cysts often go away on their own without treatment. If you have pain or other symptoms, treatment may be needed. Treatment  is also needed if the ganglion cyst limits your movement or if it gets infected. Treatment may include:  Wearing a brace or splint on your wrist or finger.  Taking anti-inflammatory medicine.  Having fluid drained from the lump with a needle (aspiration).  Getting a steroid injected into the joint.  Having surgery to remove the ganglion cyst.  Placing a pad on your shoe or wearing shoes that will not rub against the cyst if it is on your foot. Follow these instructions at home:  Do not press on the ganglion cyst, poke it with a needle, or hit it.  Take over-the-counter and prescription medicines only as told by your health care provider.  If you have a brace or splint: ? Wear it as told by your health care provider. ? Remove it as told by your health care provider. Ask if you need to remove it when you take a shower or a bath.  Watch your ganglion cyst for any changes.  Keep all follow-up visits as told by your health care provider. This is important. Contact a health care provider if:  Your ganglion cyst becomes larger or more painful.  You have pus coming from the lump.  You have weakness or numbness in the affected area.  You have a fever or chills. Get help right away if:  You have a fever and have any of these in the cyst area: ? Increased redness. ?  Red streaks. ? Swelling. Summary  A ganglion cyst is a non-cancerous, fluid-filled lump that occurs near a joint or tendon.  Ganglion cysts most often develop in the hand or wrist, but they can also develop in the shoulder, elbow, hip, knee, ankle, or foot.  Ganglion cysts often go away on their own without treatment. This information is not intended to replace advice given to you by your health care provider. Make sure you discuss any questions you have with your health care provider. Document Released: 07/12/2000 Document Revised: 06/27/2017 Document Reviewed: 03/14/2017 Elsevier Patient Education  2020 [employer].

## 2019-06-08 NOTE — Telephone Encounter (Signed)
Copied from [place] [id number]. Topic: General - Inquiry >> Jun 08, 2019  4:28 PM Karen Carr, [place] wrote: Reason for CRM: Pt called in stating she was told to see if medication was increased to 40mg  she should not take it. Pt called in stating medication is Lisinopril 40mg  and would like to know what she should do next. Pt stated she has been off of it for 2 days and that is the longest she has been off and does not want anything to happen to her. Please advise.

## 2019-06-08 NOTE — Progress Notes (Signed)
Chief Complaint  Patient presents with  . Cyst    right wrist  . Hypertension    Karen Carr is a 63 y.o. female here for a skin complaint.  Duration: 3 days Location: R wrist Pruritic? No Painful? No Drainage? No New soaps/lotions/topicals/detergents? No Other associated symptoms: Came up randomly, not changing in size Therapies tried thus far: none  Hypertension Patient presents for hypertension follow up. She does not monitor home blood pressures, but does have it checked at HD. Blood pressures ranging on average from 160's/90's. She is compliant with medications- lisinopril just increased to 20 mg/d by nephro, Toprol XL 100 mg/d, Norvasc 10 mg/d, . Patient has these side effects of medication: none She is adhering to a healthy diet overall. Exercise: active with grandchildren  ROS:  Const: No fevers Skin: As noted in HPI  Past Medical History:  Diagnosis Date  . ESRD (end stage renal disease) ([place])   . Gout   . Hyperlipidemia   . Hypertension   . Major depressive disorder   . SLE (systemic lupus erythematosus) ([place])   . Vitamin D deficiency     BP (!) 164/90 (BP Location: Left Arm, Patient Position: Sitting, Cuff Size: Normal)   Pulse 68   Temp (!) 97.1 F (36.2 C) (Temporal)   Ht 5\' 6"  (1.676 m)   Wt 169 lb (76.7 kg)   SpO2 98%   BMI 27.28 kg/m  Gen: awake, alert, appearing stated age Lungs: No accessory muscle use, CTAB Heart: RRR, no edema Skin: semisolid circular lesion on volar distal lateral wrist surface, freely moveable, smooth edges. No drainage, erythema, TTP, fluctuance, excoriation Psych: Age appropriate judgment and insight  Essential hypertension  Ganglion cyst of wrist, right  1-monitor home blood pressures on off days.  Counseled on diet and exercise.  She has been doing a good job.  Continue current medications with a recent increase of lisinopril.  I would like to see her creatinine clearance from the nephrology team.  Would consider  increasing the dose of metoprolol otherwise. 2-reassurance given for the ganglion cyst. F/u in 4 weeks. The patient voiced understanding and agreement to the plan.  [place], DO 06/08/19 3:10 PM

## 2019-06-09 DIAGNOSIS — D509 Iron deficiency anemia, unspecified: Secondary | ICD-10-CM | POA: Diagnosis not present

## 2019-06-09 DIAGNOSIS — N2581 Secondary hyperparathyroidism of renal origin: Secondary | ICD-10-CM | POA: Diagnosis not present

## 2019-06-09 DIAGNOSIS — D631 Anemia in chronic kidney disease: Secondary | ICD-10-CM | POA: Diagnosis not present

## 2019-06-09 DIAGNOSIS — N186 End stage renal disease: Secondary | ICD-10-CM | POA: Diagnosis not present

## 2019-06-09 NOTE — Telephone Encounter (Signed)
Called left message to call back 

## 2019-06-09 NOTE — Telephone Encounter (Signed)
That's fine, I will trust the nephrology team, though I would need to see her recent kidney function to be certain. We will update her chart to reflect the 40 mg daily. Ty.

## 2019-06-09 NOTE — Telephone Encounter (Signed)
That's what I thought they had put in. Where did 40 mg come from? I do want her to take the 20 mg dosage. I did not call in anything else to replace it. Ty.

## 2019-06-09 NOTE — Telephone Encounter (Signed)
Informed her of information. She stated her dialysis MD said 40 mg of lisinopril is too much for her and she stated they sent in 20 mg. She is not sure what to take at this time.  She does have a followup with PCP on 07/06/2019 at 1 to recheck BP

## 2019-06-10 NOTE — Telephone Encounter (Signed)
Lvm patient to call back about medication question

## 2019-06-11 NOTE — Telephone Encounter (Signed)
Called left message to call back regarding medication

## 2019-06-16 DIAGNOSIS — N186 End stage renal disease: Secondary | ICD-10-CM | POA: Diagnosis not present

## 2019-06-16 DIAGNOSIS — D509 Iron deficiency anemia, unspecified: Secondary | ICD-10-CM | POA: Diagnosis not present

## 2019-06-16 DIAGNOSIS — D631 Anemia in chronic kidney disease: Secondary | ICD-10-CM | POA: Diagnosis not present

## 2019-06-16 DIAGNOSIS — N2581 Secondary hyperparathyroidism of renal origin: Secondary | ICD-10-CM | POA: Diagnosis not present

## 2019-06-18 DIAGNOSIS — N2581 Secondary hyperparathyroidism of renal origin: Secondary | ICD-10-CM | POA: Diagnosis not present

## 2019-06-18 DIAGNOSIS — D509 Iron deficiency anemia, unspecified: Secondary | ICD-10-CM | POA: Diagnosis not present

## 2019-06-18 DIAGNOSIS — N186 End stage renal disease: Secondary | ICD-10-CM | POA: Diagnosis not present

## 2019-06-18 DIAGNOSIS — D631 Anemia in chronic kidney disease: Secondary | ICD-10-CM | POA: Diagnosis not present

## 2019-06-20 DIAGNOSIS — N186 End stage renal disease: Secondary | ICD-10-CM | POA: Diagnosis not present

## 2019-06-20 DIAGNOSIS — N2581 Secondary hyperparathyroidism of renal origin: Secondary | ICD-10-CM | POA: Diagnosis not present

## 2019-06-20 DIAGNOSIS — D631 Anemia in chronic kidney disease: Secondary | ICD-10-CM | POA: Diagnosis not present

## 2019-06-20 DIAGNOSIS — D509 Iron deficiency anemia, unspecified: Secondary | ICD-10-CM | POA: Diagnosis not present

## 2019-06-25 DIAGNOSIS — D509 Iron deficiency anemia, unspecified: Secondary | ICD-10-CM | POA: Diagnosis not present

## 2019-06-25 DIAGNOSIS — N2581 Secondary hyperparathyroidism of renal origin: Secondary | ICD-10-CM | POA: Diagnosis not present

## 2019-06-25 DIAGNOSIS — D631 Anemia in chronic kidney disease: Secondary | ICD-10-CM | POA: Diagnosis not present

## 2019-06-25 DIAGNOSIS — N186 End stage renal disease: Secondary | ICD-10-CM | POA: Diagnosis not present

## 2019-06-26 ENCOUNTER — Other Ambulatory Visit: Payer: Self-pay | Admitting: Family Medicine

## 2019-06-26 DIAGNOSIS — F418 Other specified anxiety disorders: Secondary | ICD-10-CM

## 2019-06-28 ENCOUNTER — Telehealth: Payer: Self-pay | Admitting: *Deleted

## 2019-06-28 NOTE — Telephone Encounter (Signed)
rx refilled  Who Is Calling Patient / Member / Family / Caregiver Call Type Triage / Clinical Relationship To Patient Self Return Phone Number [phone] (Primary) Chief Complaint Prescription Refill or Medication Request (non symptomatic) Reason for Call Medication Question / Request Initial Comment Caller is needing a refill. Translation No Nurse Assessment Nurse: Junius Creamer, RN, Debra Date/Time Eilene Ghazi Time): 06/26/2019 2:11:14 PM Confirm and document reason for call. If symptomatic, describe symptoms. ---Caller states is needing a refill on sertraline. no new or worsening symptoms

## 2019-07-06 ENCOUNTER — Other Ambulatory Visit: Payer: Self-pay

## 2019-07-06 ENCOUNTER — Ambulatory Visit (INDEPENDENT_AMBULATORY_CARE_PROVIDER_SITE_OTHER): Payer: MEDICARE | Admitting: Family Medicine

## 2019-07-06 ENCOUNTER — Encounter: Payer: Self-pay | Admitting: Family Medicine

## 2019-07-06 VITALS — BP 140/78 | HR 68 | Temp 97.6°F | Ht 66.0 in | Wt 168.1 lb

## 2019-07-06 DIAGNOSIS — F418 Other specified anxiety disorders: Secondary | ICD-10-CM | POA: Diagnosis not present

## 2019-07-06 DIAGNOSIS — I10 Essential (primary) hypertension: Secondary | ICD-10-CM | POA: Diagnosis not present

## 2019-07-06 MED ORDER — SERTRALINE HCL 50 MG PO TABS: 50.0000 mg | ORAL_TABLET | Freq: Every day | ORAL | 4 refills | Status: DC

## 2019-07-06 MED ORDER — METOPROLOL SUCCINATE ER 100 MG PO TB24: ORAL_TABLET | ORAL | 3 refills | Status: DC

## 2019-07-06 NOTE — Progress Notes (Signed)
Chief Complaint  Patient presents with  . Follow-up    Blood pressure    Subjective Karen Carr is a 63 y.o. female who presents for hypertension follow up. She does monitor home blood pressures. BPs at HD are ranging in 150's/90's intially and then it drops.  She is confused w meds; lisinopril 20 mg daily, amlodipine 10 mg daily, hydralazine 25 mg 3 times daily, metoprolol succinate 100 mg daily. Patient has these side effects of medication: none She is adhering to a healthy diet overall. Current exercise: chasing grandkids   Past Medical History:  Diagnosis Date  . ESRD (end stage renal disease) ([place])   . Gout   . Hyperlipidemia   . Hypertension   . Major depressive disorder   . SLE (systemic lupus erythematosus) ([place])   . Vitamin D deficiency     Review of Systems Cardiovascular: no chest pain Respiratory:  no shortness of breath  Exam BP 140/78 (BP Location: Right Arm, Patient Position: Sitting, Cuff Size: Normal)   Pulse 68   Temp 97.6 F (36.4 C) (Temporal)   Ht 5\' 6"  (1.676 m)   Wt 168 lb 2 oz (76.3 kg)   SpO2 98%   BMI 27.14 kg/m  General:  well developed, well nourished, in no apparent distress Heart: RRR, no bruits, no LE edema Lungs: clear to auscultation, no accessory muscle use Psych: well oriented with normal range of affect and appropriate judgment/insight  Essential hypertension - Plan: metoprolol succinate (TOPROL-XL) 100 MG 24 hr tablet, amLODipine (NORVASC) 10 MG tablet  Situational anxiety - Plan: sertraline (ZOLOFT) 50 MG tablet  Needs to take meds at same time. If still not controlled, we can make changes would increase from 150 to 200 mg/d. Could consider increasing hydralazine. Not losing much fluid with HD it sounds like.  Counseled on diet and exercise. F/u in 1 mo. The patient voiced understanding and agreement to the plan.  [place], DO 07/06/19  2:46 PM

## 2019-07-06 NOTE — Patient Instructions (Signed)
Keep the diet clean and stay active.  Take everything at 3 PM for your blood pressure medications.  Let us know if you need anything.

## 2019-07-21 ENCOUNTER — Other Ambulatory Visit: Payer: Self-pay | Admitting: Family Medicine

## 2019-07-21 DIAGNOSIS — I10 Essential (primary) hypertension: Secondary | ICD-10-CM

## 2019-07-21 MED ORDER — AMLODIPINE BESYLATE 10 MG PO TABS: ORAL_TABLET | ORAL | 0 refills | Status: DC

## 2019-07-30 DIAGNOSIS — N2581 Secondary hyperparathyroidism of renal origin: Secondary | ICD-10-CM | POA: Diagnosis not present

## 2019-07-30 DIAGNOSIS — I10 Essential (primary) hypertension: Secondary | ICD-10-CM | POA: Diagnosis not present

## 2019-07-30 DIAGNOSIS — N186 End stage renal disease: Secondary | ICD-10-CM | POA: Diagnosis not present

## 2019-07-30 DIAGNOSIS — D509 Iron deficiency anemia, unspecified: Secondary | ICD-10-CM | POA: Diagnosis not present

## 2019-08-05 DIAGNOSIS — N186 End stage renal disease: Secondary | ICD-10-CM | POA: Diagnosis not present

## 2019-08-05 DIAGNOSIS — D509 Iron deficiency anemia, unspecified: Secondary | ICD-10-CM | POA: Diagnosis not present

## 2019-08-05 DIAGNOSIS — N2581 Secondary hyperparathyroidism of renal origin: Secondary | ICD-10-CM | POA: Diagnosis not present

## 2019-08-05 DIAGNOSIS — I10 Essential (primary) hypertension: Secondary | ICD-10-CM | POA: Diagnosis not present

## 2019-08-11 DIAGNOSIS — N2581 Secondary hyperparathyroidism of renal origin: Secondary | ICD-10-CM | POA: Diagnosis not present

## 2019-08-11 DIAGNOSIS — N186 End stage renal disease: Secondary | ICD-10-CM | POA: Diagnosis not present

## 2019-08-13 DIAGNOSIS — N186 End stage renal disease: Secondary | ICD-10-CM | POA: Diagnosis not present

## 2019-08-13 DIAGNOSIS — N2581 Secondary hyperparathyroidism of renal origin: Secondary | ICD-10-CM | POA: Diagnosis not present

## 2019-08-17 DIAGNOSIS — N2581 Secondary hyperparathyroidism of renal origin: Secondary | ICD-10-CM | POA: Diagnosis not present

## 2019-08-17 DIAGNOSIS — N186 End stage renal disease: Secondary | ICD-10-CM | POA: Diagnosis not present

## 2019-08-18 DIAGNOSIS — N186 End stage renal disease: Secondary | ICD-10-CM | POA: Diagnosis not present

## 2019-08-18 DIAGNOSIS — N2581 Secondary hyperparathyroidism of renal origin: Secondary | ICD-10-CM | POA: Diagnosis not present

## 2019-08-20 DIAGNOSIS — N186 End stage renal disease: Secondary | ICD-10-CM | POA: Diagnosis not present

## 2019-08-20 DIAGNOSIS — D631 Anemia in chronic kidney disease: Secondary | ICD-10-CM | POA: Diagnosis not present

## 2019-08-20 DIAGNOSIS — N2581 Secondary hyperparathyroidism of renal origin: Secondary | ICD-10-CM | POA: Diagnosis not present

## 2019-08-23 DIAGNOSIS — D631 Anemia in chronic kidney disease: Secondary | ICD-10-CM | POA: Diagnosis not present

## 2019-08-23 DIAGNOSIS — N186 End stage renal disease: Secondary | ICD-10-CM | POA: Diagnosis not present

## 2019-08-23 DIAGNOSIS — N2581 Secondary hyperparathyroidism of renal origin: Secondary | ICD-10-CM | POA: Diagnosis not present

## 2019-08-25 DIAGNOSIS — N2581 Secondary hyperparathyroidism of renal origin: Secondary | ICD-10-CM | POA: Diagnosis not present

## 2019-08-25 DIAGNOSIS — N186 End stage renal disease: Secondary | ICD-10-CM | POA: Diagnosis not present

## 2019-08-25 DIAGNOSIS — D631 Anemia in chronic kidney disease: Secondary | ICD-10-CM | POA: Diagnosis not present

## 2019-08-26 DIAGNOSIS — M2041 Other hammer toe(s) (acquired), right foot: Secondary | ICD-10-CM | POA: Diagnosis not present

## 2019-08-26 DIAGNOSIS — B351 Tinea unguium: Secondary | ICD-10-CM | POA: Diagnosis not present

## 2019-08-26 DIAGNOSIS — L84 Corns and callosities: Secondary | ICD-10-CM | POA: Diagnosis not present

## 2019-08-26 DIAGNOSIS — D2371 Other benign neoplasm of skin of right lower limb, including hip: Secondary | ICD-10-CM | POA: Diagnosis not present

## 2019-08-26 DIAGNOSIS — N185 Chronic kidney disease, stage 5: Secondary | ICD-10-CM | POA: Diagnosis not present

## 2019-08-26 DIAGNOSIS — D2372 Other benign neoplasm of skin of left lower limb, including hip: Secondary | ICD-10-CM | POA: Diagnosis not present

## 2019-08-27 DIAGNOSIS — D631 Anemia in chronic kidney disease: Secondary | ICD-10-CM | POA: Diagnosis not present

## 2019-08-27 DIAGNOSIS — N2581 Secondary hyperparathyroidism of renal origin: Secondary | ICD-10-CM | POA: Diagnosis not present

## 2019-08-27 DIAGNOSIS — N186 End stage renal disease: Secondary | ICD-10-CM | POA: Diagnosis not present

## 2019-08-30 DIAGNOSIS — N2581 Secondary hyperparathyroidism of renal origin: Secondary | ICD-10-CM | POA: Diagnosis not present

## 2019-08-30 DIAGNOSIS — N186 End stage renal disease: Secondary | ICD-10-CM | POA: Diagnosis not present

## 2019-08-30 DIAGNOSIS — D509 Iron deficiency anemia, unspecified: Secondary | ICD-10-CM | POA: Diagnosis not present

## 2019-09-01 DIAGNOSIS — N186 End stage renal disease: Secondary | ICD-10-CM | POA: Diagnosis not present

## 2019-09-01 DIAGNOSIS — N2581 Secondary hyperparathyroidism of renal origin: Secondary | ICD-10-CM | POA: Diagnosis not present

## 2019-09-01 DIAGNOSIS — D509 Iron deficiency anemia, unspecified: Secondary | ICD-10-CM | POA: Diagnosis not present

## 2019-09-03 DIAGNOSIS — N186 End stage renal disease: Secondary | ICD-10-CM | POA: Diagnosis not present

## 2019-09-03 DIAGNOSIS — N2581 Secondary hyperparathyroidism of renal origin: Secondary | ICD-10-CM | POA: Diagnosis not present

## 2019-09-03 DIAGNOSIS — D509 Iron deficiency anemia, unspecified: Secondary | ICD-10-CM | POA: Diagnosis not present

## 2019-09-06 DIAGNOSIS — D509 Iron deficiency anemia, unspecified: Secondary | ICD-10-CM | POA: Diagnosis not present

## 2019-09-06 DIAGNOSIS — N2581 Secondary hyperparathyroidism of renal origin: Secondary | ICD-10-CM | POA: Diagnosis not present

## 2019-09-06 DIAGNOSIS — N186 End stage renal disease: Secondary | ICD-10-CM | POA: Diagnosis not present

## 2019-09-08 DIAGNOSIS — D509 Iron deficiency anemia, unspecified: Secondary | ICD-10-CM | POA: Diagnosis not present

## 2019-09-08 DIAGNOSIS — N2581 Secondary hyperparathyroidism of renal origin: Secondary | ICD-10-CM | POA: Diagnosis not present

## 2019-09-08 DIAGNOSIS — N186 End stage renal disease: Secondary | ICD-10-CM | POA: Diagnosis not present

## 2019-09-10 DIAGNOSIS — D509 Iron deficiency anemia, unspecified: Secondary | ICD-10-CM | POA: Diagnosis not present

## 2019-09-10 DIAGNOSIS — N2581 Secondary hyperparathyroidism of renal origin: Secondary | ICD-10-CM | POA: Diagnosis not present

## 2019-09-10 DIAGNOSIS — N186 End stage renal disease: Secondary | ICD-10-CM | POA: Diagnosis not present

## 2019-09-13 DIAGNOSIS — D509 Iron deficiency anemia, unspecified: Secondary | ICD-10-CM | POA: Diagnosis not present

## 2019-09-13 DIAGNOSIS — N2581 Secondary hyperparathyroidism of renal origin: Secondary | ICD-10-CM | POA: Diagnosis not present

## 2019-09-13 DIAGNOSIS — N186 End stage renal disease: Secondary | ICD-10-CM | POA: Diagnosis not present

## 2019-09-17 DIAGNOSIS — D509 Iron deficiency anemia, unspecified: Secondary | ICD-10-CM | POA: Diagnosis not present

## 2019-09-17 DIAGNOSIS — N186 End stage renal disease: Secondary | ICD-10-CM | POA: Diagnosis not present

## 2019-09-17 DIAGNOSIS — N2581 Secondary hyperparathyroidism of renal origin: Secondary | ICD-10-CM | POA: Diagnosis not present

## 2019-09-20 DIAGNOSIS — N186 End stage renal disease: Secondary | ICD-10-CM | POA: Diagnosis not present

## 2019-09-20 DIAGNOSIS — N2581 Secondary hyperparathyroidism of renal origin: Secondary | ICD-10-CM | POA: Diagnosis not present

## 2019-09-22 DIAGNOSIS — N186 End stage renal disease: Secondary | ICD-10-CM | POA: Diagnosis not present

## 2019-09-22 DIAGNOSIS — N2581 Secondary hyperparathyroidism of renal origin: Secondary | ICD-10-CM | POA: Diagnosis not present

## 2019-09-24 DIAGNOSIS — N186 End stage renal disease: Secondary | ICD-10-CM | POA: Diagnosis not present

## 2019-09-24 DIAGNOSIS — N2581 Secondary hyperparathyroidism of renal origin: Secondary | ICD-10-CM | POA: Diagnosis not present

## 2019-09-26 DIAGNOSIS — N186 End stage renal disease: Secondary | ICD-10-CM | POA: Diagnosis not present

## 2019-09-26 DIAGNOSIS — Z992 Dependence on renal dialysis: Secondary | ICD-10-CM | POA: Diagnosis not present

## 2019-09-27 DIAGNOSIS — N2581 Secondary hyperparathyroidism of renal origin: Secondary | ICD-10-CM | POA: Diagnosis not present

## 2019-09-27 DIAGNOSIS — D509 Iron deficiency anemia, unspecified: Secondary | ICD-10-CM | POA: Diagnosis not present

## 2019-09-27 DIAGNOSIS — N186 End stage renal disease: Secondary | ICD-10-CM | POA: Diagnosis not present

## 2019-09-27 DIAGNOSIS — D631 Anemia in chronic kidney disease: Secondary | ICD-10-CM | POA: Diagnosis not present

## 2019-09-29 DIAGNOSIS — N186 End stage renal disease: Secondary | ICD-10-CM | POA: Diagnosis not present

## 2019-09-29 DIAGNOSIS — N2581 Secondary hyperparathyroidism of renal origin: Secondary | ICD-10-CM | POA: Diagnosis not present

## 2019-09-29 DIAGNOSIS — D631 Anemia in chronic kidney disease: Secondary | ICD-10-CM | POA: Diagnosis not present

## 2019-09-29 DIAGNOSIS — D509 Iron deficiency anemia, unspecified: Secondary | ICD-10-CM | POA: Diagnosis not present

## 2019-10-01 DIAGNOSIS — N186 End stage renal disease: Secondary | ICD-10-CM | POA: Diagnosis not present

## 2019-10-01 DIAGNOSIS — D631 Anemia in chronic kidney disease: Secondary | ICD-10-CM | POA: Diagnosis not present

## 2019-10-01 DIAGNOSIS — D509 Iron deficiency anemia, unspecified: Secondary | ICD-10-CM | POA: Diagnosis not present

## 2019-10-01 DIAGNOSIS — N2581 Secondary hyperparathyroidism of renal origin: Secondary | ICD-10-CM | POA: Diagnosis not present

## 2019-10-04 DIAGNOSIS — N186 End stage renal disease: Secondary | ICD-10-CM | POA: Diagnosis not present

## 2019-10-04 DIAGNOSIS — D631 Anemia in chronic kidney disease: Secondary | ICD-10-CM | POA: Diagnosis not present

## 2019-10-04 DIAGNOSIS — D509 Iron deficiency anemia, unspecified: Secondary | ICD-10-CM | POA: Diagnosis not present

## 2019-10-04 DIAGNOSIS — N2581 Secondary hyperparathyroidism of renal origin: Secondary | ICD-10-CM | POA: Diagnosis not present

## 2019-10-05 DIAGNOSIS — Z01818 Encounter for other preprocedural examination: Secondary | ICD-10-CM | POA: Diagnosis not present

## 2019-10-06 DIAGNOSIS — D631 Anemia in chronic kidney disease: Secondary | ICD-10-CM | POA: Diagnosis not present

## 2019-10-06 DIAGNOSIS — N186 End stage renal disease: Secondary | ICD-10-CM | POA: Diagnosis not present

## 2019-10-06 DIAGNOSIS — D509 Iron deficiency anemia, unspecified: Secondary | ICD-10-CM | POA: Diagnosis not present

## 2019-10-06 DIAGNOSIS — N2581 Secondary hyperparathyroidism of renal origin: Secondary | ICD-10-CM | POA: Diagnosis not present

## 2019-10-08 DIAGNOSIS — N2581 Secondary hyperparathyroidism of renal origin: Secondary | ICD-10-CM | POA: Diagnosis not present

## 2019-10-08 DIAGNOSIS — D631 Anemia in chronic kidney disease: Secondary | ICD-10-CM | POA: Diagnosis not present

## 2019-10-08 DIAGNOSIS — N186 End stage renal disease: Secondary | ICD-10-CM | POA: Diagnosis not present

## 2019-10-11 DIAGNOSIS — D631 Anemia in chronic kidney disease: Secondary | ICD-10-CM | POA: Diagnosis not present

## 2019-10-11 DIAGNOSIS — N186 End stage renal disease: Secondary | ICD-10-CM | POA: Diagnosis not present

## 2019-10-11 DIAGNOSIS — N2581 Secondary hyperparathyroidism of renal origin: Secondary | ICD-10-CM | POA: Diagnosis not present

## 2019-10-18 DIAGNOSIS — N2581 Secondary hyperparathyroidism of renal origin: Secondary | ICD-10-CM | POA: Diagnosis not present

## 2019-10-18 DIAGNOSIS — D631 Anemia in chronic kidney disease: Secondary | ICD-10-CM | POA: Diagnosis not present

## 2019-10-18 DIAGNOSIS — N186 End stage renal disease: Secondary | ICD-10-CM | POA: Diagnosis not present

## 2019-10-20 DIAGNOSIS — N186 End stage renal disease: Secondary | ICD-10-CM | POA: Diagnosis not present

## 2019-10-20 DIAGNOSIS — D631 Anemia in chronic kidney disease: Secondary | ICD-10-CM | POA: Diagnosis not present

## 2019-10-20 DIAGNOSIS — N2581 Secondary hyperparathyroidism of renal origin: Secondary | ICD-10-CM | POA: Diagnosis not present

## 2019-10-22 ENCOUNTER — Ambulatory Visit: Payer: Medicare HMO | Attending: Internal Medicine

## 2019-10-22 DIAGNOSIS — Z23 Encounter for immunization: Secondary | ICD-10-CM

## 2019-10-22 NOTE — Progress Notes (Signed)
   Covid-19 Vaccination Clinic  Name:  Karen Carr    MRN: [id number] DOB: 11-07-1955  10/22/2019  [name] was observed post Covid-19 immunization for 15 minutes without incident. She was provided with Vaccine Information Sheet and instruction to access the V-Safe system.   [name] was instructed to call 911 with any severe reactions post vaccine: Marland Kitchen Difficulty breathing  . Swelling of face and throat  . A fast heartbeat  . A bad rash all over body  . Dizziness and weakness   Immunizations Administered    Name Date Dose VIS Date Route   Pfizer COVID-19 Vaccine 10/22/2019  9:58 AM 0.3 mL 07/09/2019 Intramuscular   Manufacturer: [place]   Lot: [id number]   [place]: [id number]

## 2019-10-25 DIAGNOSIS — R0902 Hypoxemia: Secondary | ICD-10-CM | POA: Diagnosis not present

## 2019-10-25 DIAGNOSIS — M3214 Glomerular disease in systemic lupus erythematosus: Secondary | ICD-10-CM | POA: Diagnosis not present

## 2019-10-25 DIAGNOSIS — Z992 Dependence on renal dialysis: Secondary | ICD-10-CM | POA: Diagnosis not present

## 2019-10-25 DIAGNOSIS — I671 Cerebral aneurysm, nonruptured: Secondary | ICD-10-CM | POA: Diagnosis not present

## 2019-10-25 DIAGNOSIS — I161 Hypertensive emergency: Secondary | ICD-10-CM | POA: Diagnosis not present

## 2019-10-25 DIAGNOSIS — I159 Secondary hypertension, unspecified: Secondary | ICD-10-CM | POA: Diagnosis not present

## 2019-10-25 DIAGNOSIS — I120 Hypertensive chronic kidney disease with stage 5 chronic kidney disease or end stage renal disease: Secondary | ICD-10-CM | POA: Diagnosis not present

## 2019-10-25 DIAGNOSIS — S39012A Strain of muscle, fascia and tendon of lower back, initial encounter: Secondary | ICD-10-CM | POA: Diagnosis not present

## 2019-10-25 DIAGNOSIS — N186 End stage renal disease: Secondary | ICD-10-CM | POA: Diagnosis not present

## 2019-10-25 DIAGNOSIS — I160 Hypertensive urgency: Secondary | ICD-10-CM | POA: Diagnosis not present

## 2019-10-25 DIAGNOSIS — R109 Unspecified abdominal pain: Secondary | ICD-10-CM | POA: Diagnosis not present

## 2019-10-25 DIAGNOSIS — R0602 Shortness of breath: Secondary | ICD-10-CM | POA: Diagnosis not present

## 2019-10-25 DIAGNOSIS — M545 Low back pain: Secondary | ICD-10-CM | POA: Diagnosis not present

## 2019-10-25 DIAGNOSIS — E1122 Type 2 diabetes mellitus with diabetic chronic kidney disease: Secondary | ICD-10-CM | POA: Diagnosis not present

## 2019-10-25 DIAGNOSIS — Z79899 Other long term (current) drug therapy: Secondary | ICD-10-CM | POA: Diagnosis not present

## 2019-10-25 DIAGNOSIS — N281 Cyst of kidney, acquired: Secondary | ICD-10-CM | POA: Diagnosis not present

## 2019-10-25 DIAGNOSIS — R52 Pain, unspecified: Secondary | ICD-10-CM | POA: Diagnosis not present

## 2019-10-25 DIAGNOSIS — M5489 Other dorsalgia: Secondary | ICD-10-CM | POA: Diagnosis not present

## 2019-10-25 DIAGNOSIS — I1311 Hypertensive heart and chronic kidney disease without heart failure, with stage 5 chronic kidney disease, or end stage renal disease: Secondary | ICD-10-CM | POA: Diagnosis not present

## 2019-10-25 DIAGNOSIS — M329 Systemic lupus erythematosus, unspecified: Secondary | ICD-10-CM | POA: Diagnosis not present

## 2019-10-25 DIAGNOSIS — I10 Essential (primary) hypertension: Secondary | ICD-10-CM | POA: Diagnosis not present

## 2019-10-25 DIAGNOSIS — R0789 Other chest pain: Secondary | ICD-10-CM | POA: Diagnosis not present

## 2019-10-26 DIAGNOSIS — I161 Hypertensive emergency: Secondary | ICD-10-CM | POA: Diagnosis not present

## 2019-10-26 DIAGNOSIS — E042 Nontoxic multinodular goiter: Secondary | ICD-10-CM | POA: Diagnosis not present

## 2019-10-26 DIAGNOSIS — I160 Hypertensive urgency: Secondary | ICD-10-CM | POA: Diagnosis not present

## 2019-10-26 DIAGNOSIS — I313 Pericardial effusion (noninflammatory): Secondary | ICD-10-CM | POA: Diagnosis not present

## 2019-10-26 DIAGNOSIS — I214 Non-ST elevation (NSTEMI) myocardial infarction: Secondary | ICD-10-CM | POA: Diagnosis not present

## 2019-10-26 DIAGNOSIS — I120 Hypertensive chronic kidney disease with stage 5 chronic kidney disease or end stage renal disease: Secondary | ICD-10-CM | POA: Diagnosis not present

## 2019-10-26 DIAGNOSIS — N281 Cyst of kidney, acquired: Secondary | ICD-10-CM | POA: Diagnosis not present

## 2019-10-26 DIAGNOSIS — S39012A Strain of muscle, fascia and tendon of lower back, initial encounter: Secondary | ICD-10-CM | POA: Diagnosis not present

## 2019-10-26 DIAGNOSIS — I159 Secondary hypertension, unspecified: Secondary | ICD-10-CM | POA: Diagnosis not present

## 2019-10-26 DIAGNOSIS — N186 End stage renal disease: Secondary | ICD-10-CM | POA: Diagnosis not present

## 2019-10-26 DIAGNOSIS — Z992 Dependence on renal dialysis: Secondary | ICD-10-CM | POA: Diagnosis not present

## 2019-10-26 DIAGNOSIS — I517 Cardiomegaly: Secondary | ICD-10-CM | POA: Diagnosis not present

## 2019-10-26 DIAGNOSIS — I671 Cerebral aneurysm, nonruptured: Secondary | ICD-10-CM | POA: Diagnosis not present

## 2019-10-27 DIAGNOSIS — I517 Cardiomegaly: Secondary | ICD-10-CM | POA: Diagnosis not present

## 2019-10-27 DIAGNOSIS — I161 Hypertensive emergency: Secondary | ICD-10-CM | POA: Diagnosis not present

## 2019-10-27 DIAGNOSIS — N281 Cyst of kidney, acquired: Secondary | ICD-10-CM | POA: Diagnosis not present

## 2019-10-27 DIAGNOSIS — S39012A Strain of muscle, fascia and tendon of lower back, initial encounter: Secondary | ICD-10-CM | POA: Diagnosis not present

## 2019-10-27 DIAGNOSIS — I120 Hypertensive chronic kidney disease with stage 5 chronic kidney disease or end stage renal disease: Secondary | ICD-10-CM | POA: Diagnosis not present

## 2019-10-27 DIAGNOSIS — R001 Bradycardia, unspecified: Secondary | ICD-10-CM | POA: Diagnosis not present

## 2019-10-27 DIAGNOSIS — I671 Cerebral aneurysm, nonruptured: Secondary | ICD-10-CM | POA: Diagnosis not present

## 2019-10-27 DIAGNOSIS — N186 End stage renal disease: Secondary | ICD-10-CM | POA: Diagnosis not present

## 2019-10-27 DIAGNOSIS — Z992 Dependence on renal dialysis: Secondary | ICD-10-CM | POA: Diagnosis not present

## 2019-10-27 DIAGNOSIS — M329 Systemic lupus erythematosus, unspecified: Secondary | ICD-10-CM | POA: Diagnosis not present

## 2019-10-27 DIAGNOSIS — Z79899 Other long term (current) drug therapy: Secondary | ICD-10-CM | POA: Diagnosis not present

## 2019-10-28 DIAGNOSIS — F329 Major depressive disorder, single episode, unspecified: Secondary | ICD-10-CM | POA: Diagnosis not present

## 2019-10-28 DIAGNOSIS — Z992 Dependence on renal dialysis: Secondary | ICD-10-CM | POA: Diagnosis not present

## 2019-10-28 DIAGNOSIS — N186 End stage renal disease: Secondary | ICD-10-CM | POA: Diagnosis not present

## 2019-10-28 DIAGNOSIS — N281 Cyst of kidney, acquired: Secondary | ICD-10-CM | POA: Diagnosis not present

## 2019-10-28 DIAGNOSIS — M3214 Glomerular disease in systemic lupus erythematosus: Secondary | ICD-10-CM | POA: Diagnosis not present

## 2019-10-28 DIAGNOSIS — I161 Hypertensive emergency: Secondary | ICD-10-CM | POA: Diagnosis not present

## 2019-10-28 DIAGNOSIS — I120 Hypertensive chronic kidney disease with stage 5 chronic kidney disease or end stage renal disease: Secondary | ICD-10-CM | POA: Diagnosis not present

## 2019-10-28 DIAGNOSIS — I6011 Nontraumatic subarachnoid hemorrhage from right middle cerebral artery: Secondary | ICD-10-CM | POA: Diagnosis not present

## 2019-10-29 DIAGNOSIS — N186 End stage renal disease: Secondary | ICD-10-CM | POA: Diagnosis not present

## 2019-10-29 DIAGNOSIS — N2581 Secondary hyperparathyroidism of renal origin: Secondary | ICD-10-CM | POA: Diagnosis not present

## 2019-10-29 DIAGNOSIS — D509 Iron deficiency anemia, unspecified: Secondary | ICD-10-CM | POA: Diagnosis not present

## 2019-10-29 DIAGNOSIS — D631 Anemia in chronic kidney disease: Secondary | ICD-10-CM | POA: Diagnosis not present

## 2019-11-01 DIAGNOSIS — D509 Iron deficiency anemia, unspecified: Secondary | ICD-10-CM | POA: Diagnosis not present

## 2019-11-01 DIAGNOSIS — N186 End stage renal disease: Secondary | ICD-10-CM | POA: Diagnosis not present

## 2019-11-01 DIAGNOSIS — D631 Anemia in chronic kidney disease: Secondary | ICD-10-CM | POA: Diagnosis not present

## 2019-11-01 DIAGNOSIS — N2581 Secondary hyperparathyroidism of renal origin: Secondary | ICD-10-CM | POA: Diagnosis not present

## 2019-11-03 DIAGNOSIS — N2581 Secondary hyperparathyroidism of renal origin: Secondary | ICD-10-CM | POA: Diagnosis not present

## 2019-11-03 DIAGNOSIS — D509 Iron deficiency anemia, unspecified: Secondary | ICD-10-CM | POA: Diagnosis not present

## 2019-11-03 DIAGNOSIS — N186 End stage renal disease: Secondary | ICD-10-CM | POA: Diagnosis not present

## 2019-11-03 DIAGNOSIS — D631 Anemia in chronic kidney disease: Secondary | ICD-10-CM | POA: Diagnosis not present

## 2019-11-05 DIAGNOSIS — F329 Major depressive disorder, single episode, unspecified: Secondary | ICD-10-CM | POA: Diagnosis not present

## 2019-11-05 DIAGNOSIS — I120 Hypertensive chronic kidney disease with stage 5 chronic kidney disease or end stage renal disease: Secondary | ICD-10-CM | POA: Diagnosis not present

## 2019-11-05 DIAGNOSIS — E785 Hyperlipidemia, unspecified: Secondary | ICD-10-CM | POA: Diagnosis not present

## 2019-11-05 DIAGNOSIS — N186 End stage renal disease: Secondary | ICD-10-CM | POA: Diagnosis not present

## 2019-11-05 DIAGNOSIS — I671 Cerebral aneurysm, nonruptured: Secondary | ICD-10-CM | POA: Diagnosis not present

## 2019-11-05 DIAGNOSIS — Z992 Dependence on renal dialysis: Secondary | ICD-10-CM | POA: Diagnosis not present

## 2019-11-05 DIAGNOSIS — M3214 Glomerular disease in systemic lupus erythematosus: Secondary | ICD-10-CM | POA: Diagnosis not present

## 2019-11-08 DIAGNOSIS — N186 End stage renal disease: Secondary | ICD-10-CM | POA: Diagnosis not present

## 2019-11-08 DIAGNOSIS — D509 Iron deficiency anemia, unspecified: Secondary | ICD-10-CM | POA: Diagnosis not present

## 2019-11-08 DIAGNOSIS — D631 Anemia in chronic kidney disease: Secondary | ICD-10-CM | POA: Diagnosis not present

## 2019-11-08 DIAGNOSIS — N2581 Secondary hyperparathyroidism of renal origin: Secondary | ICD-10-CM | POA: Diagnosis not present

## 2019-11-10 DIAGNOSIS — N2581 Secondary hyperparathyroidism of renal origin: Secondary | ICD-10-CM | POA: Diagnosis not present

## 2019-11-10 DIAGNOSIS — D631 Anemia in chronic kidney disease: Secondary | ICD-10-CM | POA: Diagnosis not present

## 2019-11-10 DIAGNOSIS — D509 Iron deficiency anemia, unspecified: Secondary | ICD-10-CM | POA: Diagnosis not present

## 2019-11-10 DIAGNOSIS — N186 End stage renal disease: Secondary | ICD-10-CM | POA: Diagnosis not present

## 2019-11-15 DIAGNOSIS — N186 End stage renal disease: Secondary | ICD-10-CM | POA: Diagnosis not present

## 2019-11-15 DIAGNOSIS — D631 Anemia in chronic kidney disease: Secondary | ICD-10-CM | POA: Diagnosis not present

## 2019-11-15 DIAGNOSIS — N2581 Secondary hyperparathyroidism of renal origin: Secondary | ICD-10-CM | POA: Diagnosis not present

## 2019-11-15 DIAGNOSIS — D509 Iron deficiency anemia, unspecified: Secondary | ICD-10-CM | POA: Diagnosis not present

## 2019-11-16 ENCOUNTER — Ambulatory Visit: Payer: Medicare HMO | Attending: Internal Medicine

## 2019-11-16 DIAGNOSIS — Z23 Encounter for immunization: Secondary | ICD-10-CM

## 2019-11-16 NOTE — Progress Notes (Signed)
   Covid-19 Vaccination Clinic  Name:  Karen Carr    MRN: [id number] DOB: 12/23/1955  11/16/2019  Karen Carr was observed post Covid-19 immunization for 15 minutes without incident. She was provided with Vaccine Information Sheet and instruction to access the V-Safe system.   Karen Carr was instructed to call 911 with any severe reactions post vaccine: Marland Kitchen Difficulty breathing  . Swelling of face and throat  . A fast heartbeat  . A bad rash all over body  . Dizziness and weakness   Immunizations Administered    Name Date Dose VIS Date Route   Pfizer COVID-19 Vaccine 11/16/2019  9:17 AM 0.3 mL 09/22/2018 Intramuscular   Manufacturer: [place]   Lot: [id number]   [place]: [id number]

## 2019-11-17 DIAGNOSIS — N186 End stage renal disease: Secondary | ICD-10-CM | POA: Diagnosis not present

## 2019-11-17 DIAGNOSIS — N2581 Secondary hyperparathyroidism of renal origin: Secondary | ICD-10-CM | POA: Diagnosis not present

## 2019-11-17 DIAGNOSIS — D509 Iron deficiency anemia, unspecified: Secondary | ICD-10-CM | POA: Diagnosis not present

## 2019-11-17 DIAGNOSIS — D631 Anemia in chronic kidney disease: Secondary | ICD-10-CM | POA: Diagnosis not present

## 2019-11-19 DIAGNOSIS — D509 Iron deficiency anemia, unspecified: Secondary | ICD-10-CM | POA: Diagnosis not present

## 2019-11-19 DIAGNOSIS — N2581 Secondary hyperparathyroidism of renal origin: Secondary | ICD-10-CM | POA: Diagnosis not present

## 2019-11-19 DIAGNOSIS — D631 Anemia in chronic kidney disease: Secondary | ICD-10-CM | POA: Diagnosis not present

## 2019-11-19 DIAGNOSIS — N186 End stage renal disease: Secondary | ICD-10-CM | POA: Diagnosis not present

## 2019-11-24 DIAGNOSIS — N186 End stage renal disease: Secondary | ICD-10-CM | POA: Diagnosis not present

## 2019-11-24 DIAGNOSIS — D509 Iron deficiency anemia, unspecified: Secondary | ICD-10-CM | POA: Diagnosis not present

## 2019-11-24 DIAGNOSIS — D631 Anemia in chronic kidney disease: Secondary | ICD-10-CM | POA: Diagnosis not present

## 2019-11-24 DIAGNOSIS — N2581 Secondary hyperparathyroidism of renal origin: Secondary | ICD-10-CM | POA: Diagnosis not present

## 2019-11-26 DIAGNOSIS — N186 End stage renal disease: Secondary | ICD-10-CM | POA: Diagnosis not present

## 2019-11-26 DIAGNOSIS — D631 Anemia in chronic kidney disease: Secondary | ICD-10-CM | POA: Diagnosis not present

## 2019-11-26 DIAGNOSIS — D509 Iron deficiency anemia, unspecified: Secondary | ICD-10-CM | POA: Diagnosis not present

## 2019-11-26 DIAGNOSIS — Z992 Dependence on renal dialysis: Secondary | ICD-10-CM | POA: Diagnosis not present

## 2019-11-26 DIAGNOSIS — N2581 Secondary hyperparathyroidism of renal origin: Secondary | ICD-10-CM | POA: Diagnosis not present

## 2019-11-29 DIAGNOSIS — N2581 Secondary hyperparathyroidism of renal origin: Secondary | ICD-10-CM | POA: Diagnosis not present

## 2019-11-29 DIAGNOSIS — Z23 Encounter for immunization: Secondary | ICD-10-CM | POA: Diagnosis not present

## 2019-11-29 DIAGNOSIS — D509 Iron deficiency anemia, unspecified: Secondary | ICD-10-CM | POA: Diagnosis not present

## 2019-11-29 DIAGNOSIS — N186 End stage renal disease: Secondary | ICD-10-CM | POA: Diagnosis not present

## 2019-12-01 DIAGNOSIS — Z23 Encounter for immunization: Secondary | ICD-10-CM | POA: Diagnosis not present

## 2019-12-01 DIAGNOSIS — N186 End stage renal disease: Secondary | ICD-10-CM | POA: Diagnosis not present

## 2019-12-01 DIAGNOSIS — D509 Iron deficiency anemia, unspecified: Secondary | ICD-10-CM | POA: Diagnosis not present

## 2019-12-01 DIAGNOSIS — N2581 Secondary hyperparathyroidism of renal origin: Secondary | ICD-10-CM | POA: Diagnosis not present

## 2019-12-03 DIAGNOSIS — N186 End stage renal disease: Secondary | ICD-10-CM | POA: Diagnosis not present

## 2019-12-03 DIAGNOSIS — D509 Iron deficiency anemia, unspecified: Secondary | ICD-10-CM | POA: Diagnosis not present

## 2019-12-03 DIAGNOSIS — N2581 Secondary hyperparathyroidism of renal origin: Secondary | ICD-10-CM | POA: Diagnosis not present

## 2019-12-03 DIAGNOSIS — Z23 Encounter for immunization: Secondary | ICD-10-CM | POA: Diagnosis not present

## 2019-12-06 DIAGNOSIS — N186 End stage renal disease: Secondary | ICD-10-CM | POA: Diagnosis not present

## 2019-12-06 DIAGNOSIS — Z23 Encounter for immunization: Secondary | ICD-10-CM | POA: Diagnosis not present

## 2019-12-06 DIAGNOSIS — N2581 Secondary hyperparathyroidism of renal origin: Secondary | ICD-10-CM | POA: Diagnosis not present

## 2019-12-06 DIAGNOSIS — D509 Iron deficiency anemia, unspecified: Secondary | ICD-10-CM | POA: Diagnosis not present

## 2019-12-08 DIAGNOSIS — Z23 Encounter for immunization: Secondary | ICD-10-CM | POA: Diagnosis not present

## 2019-12-08 DIAGNOSIS — D509 Iron deficiency anemia, unspecified: Secondary | ICD-10-CM | POA: Diagnosis not present

## 2019-12-08 DIAGNOSIS — N186 End stage renal disease: Secondary | ICD-10-CM | POA: Diagnosis not present

## 2019-12-08 DIAGNOSIS — N2581 Secondary hyperparathyroidism of renal origin: Secondary | ICD-10-CM | POA: Diagnosis not present

## 2019-12-10 DIAGNOSIS — N2581 Secondary hyperparathyroidism of renal origin: Secondary | ICD-10-CM | POA: Diagnosis not present

## 2019-12-10 DIAGNOSIS — Z23 Encounter for immunization: Secondary | ICD-10-CM | POA: Diagnosis not present

## 2019-12-10 DIAGNOSIS — D509 Iron deficiency anemia, unspecified: Secondary | ICD-10-CM | POA: Diagnosis not present

## 2019-12-10 DIAGNOSIS — N186 End stage renal disease: Secondary | ICD-10-CM | POA: Diagnosis not present

## 2019-12-14 DIAGNOSIS — I671 Cerebral aneurysm, nonruptured: Secondary | ICD-10-CM | POA: Diagnosis not present

## 2019-12-14 DIAGNOSIS — I729 Aneurysm of unspecified site: Secondary | ICD-10-CM | POA: Insufficient documentation

## 2019-12-15 ENCOUNTER — Telehealth: Payer: Self-pay | Admitting: Family Medicine

## 2019-12-15 DIAGNOSIS — N2581 Secondary hyperparathyroidism of renal origin: Secondary | ICD-10-CM | POA: Diagnosis not present

## 2019-12-15 DIAGNOSIS — D509 Iron deficiency anemia, unspecified: Secondary | ICD-10-CM | POA: Diagnosis not present

## 2019-12-15 DIAGNOSIS — Z23 Encounter for immunization: Secondary | ICD-10-CM | POA: Diagnosis not present

## 2019-12-15 DIAGNOSIS — N186 End stage renal disease: Secondary | ICD-10-CM | POA: Diagnosis not present

## 2019-12-15 NOTE — Telephone Encounter (Signed)
[facility]  Call Back # [phone] Called requesting vaccine record sent to [facility] [phone]

## 2019-12-16 NOTE — Telephone Encounter (Signed)
Faxed to [employer]

## 2019-12-17 DIAGNOSIS — N186 End stage renal disease: Secondary | ICD-10-CM | POA: Diagnosis not present

## 2019-12-17 DIAGNOSIS — N2581 Secondary hyperparathyroidism of renal origin: Secondary | ICD-10-CM | POA: Diagnosis not present

## 2019-12-17 DIAGNOSIS — D509 Iron deficiency anemia, unspecified: Secondary | ICD-10-CM | POA: Diagnosis not present

## 2019-12-17 DIAGNOSIS — Z23 Encounter for immunization: Secondary | ICD-10-CM | POA: Diagnosis not present

## 2019-12-22 DIAGNOSIS — Z23 Encounter for immunization: Secondary | ICD-10-CM | POA: Diagnosis not present

## 2019-12-22 DIAGNOSIS — D509 Iron deficiency anemia, unspecified: Secondary | ICD-10-CM | POA: Diagnosis not present

## 2019-12-22 DIAGNOSIS — N186 End stage renal disease: Secondary | ICD-10-CM | POA: Diagnosis not present

## 2019-12-22 DIAGNOSIS — N2581 Secondary hyperparathyroidism of renal origin: Secondary | ICD-10-CM | POA: Diagnosis not present

## 2019-12-24 DIAGNOSIS — N2581 Secondary hyperparathyroidism of renal origin: Secondary | ICD-10-CM | POA: Diagnosis not present

## 2019-12-24 DIAGNOSIS — Z23 Encounter for immunization: Secondary | ICD-10-CM | POA: Diagnosis not present

## 2019-12-24 DIAGNOSIS — D509 Iron deficiency anemia, unspecified: Secondary | ICD-10-CM | POA: Diagnosis not present

## 2019-12-24 DIAGNOSIS — N186 End stage renal disease: Secondary | ICD-10-CM | POA: Diagnosis not present

## 2019-12-27 DIAGNOSIS — N186 End stage renal disease: Secondary | ICD-10-CM | POA: Diagnosis not present

## 2019-12-27 DIAGNOSIS — Z992 Dependence on renal dialysis: Secondary | ICD-10-CM | POA: Diagnosis not present

## 2019-12-29 DIAGNOSIS — N2581 Secondary hyperparathyroidism of renal origin: Secondary | ICD-10-CM | POA: Diagnosis not present

## 2019-12-29 DIAGNOSIS — D631 Anemia in chronic kidney disease: Secondary | ICD-10-CM | POA: Diagnosis not present

## 2019-12-29 DIAGNOSIS — N186 End stage renal disease: Secondary | ICD-10-CM | POA: Diagnosis not present

## 2019-12-29 DIAGNOSIS — D509 Iron deficiency anemia, unspecified: Secondary | ICD-10-CM | POA: Diagnosis not present

## 2019-12-31 DIAGNOSIS — N186 End stage renal disease: Secondary | ICD-10-CM | POA: Diagnosis not present

## 2019-12-31 DIAGNOSIS — D509 Iron deficiency anemia, unspecified: Secondary | ICD-10-CM | POA: Diagnosis not present

## 2019-12-31 DIAGNOSIS — D631 Anemia in chronic kidney disease: Secondary | ICD-10-CM | POA: Diagnosis not present

## 2019-12-31 DIAGNOSIS — N2581 Secondary hyperparathyroidism of renal origin: Secondary | ICD-10-CM | POA: Diagnosis not present

## 2020-01-03 DIAGNOSIS — N2581 Secondary hyperparathyroidism of renal origin: Secondary | ICD-10-CM | POA: Diagnosis not present

## 2020-01-03 DIAGNOSIS — N186 End stage renal disease: Secondary | ICD-10-CM | POA: Diagnosis not present

## 2020-01-03 DIAGNOSIS — D631 Anemia in chronic kidney disease: Secondary | ICD-10-CM | POA: Diagnosis not present

## 2020-01-03 DIAGNOSIS — D509 Iron deficiency anemia, unspecified: Secondary | ICD-10-CM | POA: Diagnosis not present

## 2020-01-05 DIAGNOSIS — D509 Iron deficiency anemia, unspecified: Secondary | ICD-10-CM | POA: Diagnosis not present

## 2020-01-05 DIAGNOSIS — N2581 Secondary hyperparathyroidism of renal origin: Secondary | ICD-10-CM | POA: Diagnosis not present

## 2020-01-05 DIAGNOSIS — N186 End stage renal disease: Secondary | ICD-10-CM | POA: Diagnosis not present

## 2020-01-05 DIAGNOSIS — D631 Anemia in chronic kidney disease: Secondary | ICD-10-CM | POA: Diagnosis not present

## 2020-01-07 DIAGNOSIS — N2581 Secondary hyperparathyroidism of renal origin: Secondary | ICD-10-CM | POA: Diagnosis not present

## 2020-01-07 DIAGNOSIS — D509 Iron deficiency anemia, unspecified: Secondary | ICD-10-CM | POA: Diagnosis not present

## 2020-01-07 DIAGNOSIS — N186 End stage renal disease: Secondary | ICD-10-CM | POA: Diagnosis not present

## 2020-01-07 DIAGNOSIS — D631 Anemia in chronic kidney disease: Secondary | ICD-10-CM | POA: Diagnosis not present

## 2020-01-10 DIAGNOSIS — D509 Iron deficiency anemia, unspecified: Secondary | ICD-10-CM | POA: Diagnosis not present

## 2020-01-10 DIAGNOSIS — N186 End stage renal disease: Secondary | ICD-10-CM | POA: Diagnosis not present

## 2020-01-10 DIAGNOSIS — D631 Anemia in chronic kidney disease: Secondary | ICD-10-CM | POA: Diagnosis not present

## 2020-01-10 DIAGNOSIS — N2581 Secondary hyperparathyroidism of renal origin: Secondary | ICD-10-CM | POA: Diagnosis not present

## 2020-01-12 DIAGNOSIS — N186 End stage renal disease: Secondary | ICD-10-CM | POA: Diagnosis not present

## 2020-01-12 DIAGNOSIS — D631 Anemia in chronic kidney disease: Secondary | ICD-10-CM | POA: Diagnosis not present

## 2020-01-12 DIAGNOSIS — D509 Iron deficiency anemia, unspecified: Secondary | ICD-10-CM | POA: Diagnosis not present

## 2020-01-12 DIAGNOSIS — N2581 Secondary hyperparathyroidism of renal origin: Secondary | ICD-10-CM | POA: Diagnosis not present

## 2020-01-14 DIAGNOSIS — D509 Iron deficiency anemia, unspecified: Secondary | ICD-10-CM | POA: Diagnosis not present

## 2020-01-14 DIAGNOSIS — N186 End stage renal disease: Secondary | ICD-10-CM | POA: Diagnosis not present

## 2020-01-14 DIAGNOSIS — D631 Anemia in chronic kidney disease: Secondary | ICD-10-CM | POA: Diagnosis not present

## 2020-01-14 DIAGNOSIS — N2581 Secondary hyperparathyroidism of renal origin: Secondary | ICD-10-CM | POA: Diagnosis not present

## 2020-01-17 DIAGNOSIS — N186 End stage renal disease: Secondary | ICD-10-CM | POA: Diagnosis not present

## 2020-01-17 DIAGNOSIS — D509 Iron deficiency anemia, unspecified: Secondary | ICD-10-CM | POA: Diagnosis not present

## 2020-01-17 DIAGNOSIS — N2581 Secondary hyperparathyroidism of renal origin: Secondary | ICD-10-CM | POA: Diagnosis not present

## 2020-01-17 DIAGNOSIS — D631 Anemia in chronic kidney disease: Secondary | ICD-10-CM | POA: Diagnosis not present

## 2020-01-19 DIAGNOSIS — D509 Iron deficiency anemia, unspecified: Secondary | ICD-10-CM | POA: Diagnosis not present

## 2020-01-19 DIAGNOSIS — D631 Anemia in chronic kidney disease: Secondary | ICD-10-CM | POA: Diagnosis not present

## 2020-01-19 DIAGNOSIS — N186 End stage renal disease: Secondary | ICD-10-CM | POA: Diagnosis not present

## 2020-01-19 DIAGNOSIS — N2581 Secondary hyperparathyroidism of renal origin: Secondary | ICD-10-CM | POA: Diagnosis not present

## 2020-01-21 DIAGNOSIS — N2581 Secondary hyperparathyroidism of renal origin: Secondary | ICD-10-CM | POA: Diagnosis not present

## 2020-01-21 DIAGNOSIS — N186 End stage renal disease: Secondary | ICD-10-CM | POA: Diagnosis not present

## 2020-01-21 DIAGNOSIS — D631 Anemia in chronic kidney disease: Secondary | ICD-10-CM | POA: Diagnosis not present

## 2020-01-21 DIAGNOSIS — D509 Iron deficiency anemia, unspecified: Secondary | ICD-10-CM | POA: Diagnosis not present

## 2020-01-24 ENCOUNTER — Ambulatory Visit (INDEPENDENT_AMBULATORY_CARE_PROVIDER_SITE_OTHER): Payer: Medicare HMO | Admitting: Family Medicine

## 2020-01-24 ENCOUNTER — Other Ambulatory Visit: Payer: Self-pay

## 2020-01-24 ENCOUNTER — Encounter: Payer: Self-pay | Admitting: Family Medicine

## 2020-01-24 VITALS — BP 162/70 | HR 75 | Temp 95.2°F | Ht 66.0 in | Wt 152.1 lb

## 2020-01-24 DIAGNOSIS — R21 Rash and other nonspecific skin eruption: Secondary | ICD-10-CM

## 2020-01-24 DIAGNOSIS — I10 Essential (primary) hypertension: Secondary | ICD-10-CM | POA: Diagnosis not present

## 2020-01-24 DIAGNOSIS — F17200 Nicotine dependence, unspecified, uncomplicated: Secondary | ICD-10-CM | POA: Diagnosis not present

## 2020-01-24 MED ORDER — HYDRALAZINE HCL 50 MG PO TABS: 50.0000 mg | ORAL_TABLET | Freq: Three times a day (TID) | ORAL | 3 refills | Status: DC

## 2020-01-24 MED ORDER — VARENICLINE TARTRATE 0.5 MG X 11 & 1 MG X 42 PO MISC: ORAL | 0 refills | Status: DC

## 2020-01-24 NOTE — Progress Notes (Signed)
Chief Complaint  Patient presents with  . Follow-up    Karen Carr is a 64 y.o. female here for a skin complaint.  Duration: 1 year off and on Location: legs Pruritic? Yes Painful? No Drainage? No New soaps/lotions/topicals/detergents? No Sick contacts? No Other associated symptoms: started after  Therapies tried thus far: none  Hypertension Patient presents for hypertension follow up. BP's running in 150-160's despite HD.  She is compliant with medications- hydralazine 25 mg TID, Toprol XL 100 mg/d, lisinopril 20 mg/d. Patient has these side effects of medication: none She is adhering to a healthy diet overall. Continues to lose weight.  Exercise: walking  She is still smoking. Scared of the patches. Interested in a pill. Has never been on anything before.   Past Medical History:  Diagnosis Date  . ESRD (end stage renal disease) ([place])   . Gout   . Hyperlipidemia   . Hypertension   . Major depressive disorder   . SLE (systemic lupus erythematosus) ([place])   . Vitamin D deficiency     BP (!) 162/70 (BP Location: Left Arm, Patient Position: Sitting, Cuff Size: Normal)   Pulse 75   Temp (!) 95.2 F (35.1 C) (Temporal)   Ht 5\' 6"  (1.676 m)   Wt 152 lb 2 oz (69 kg)   SpO2 96%   BMI 24.55 kg/m  Gen: awake, alert, appearing stated age Lungs: No accessory muscle use Skin: on LE's, purpuric patches in a reticular pattern that do not blanch. No drainage, erythema, TTP, fluctuance, excoriation Psych: Age appropriate judgment and insight  Rash  Smoking - Plan: varenicline (CHANTIX PAK) 0.5 MG X 11 & 1 MG X 42 tablet  Essential hypertension - Plan: hydrALAZINE (APRESOLINE) 50 MG tablet  1. Reassurance.  2. Chantix.  3. Increase hydralazine from 25 mg tid to 50 mg TID.  F/u in 1 mo. The patient voiced understanding and agreement to the plan.  [place], DO 01/24/20 4:26 PM

## 2020-01-24 NOTE — Patient Instructions (Addendum)
Consider losing 5-7 lbs more to see how things go.  Keep the diet clean and stay active.  We are increasing the dosage of the Hydralazine.   I wouldn't do anything with the skin issue right now. If you change your mind, we can set you up with a skin specialist.   Let us know if you need anything.

## 2020-01-25 ENCOUNTER — Telehealth: Payer: Self-pay | Admitting: Family Medicine

## 2020-01-25 NOTE — Telephone Encounter (Signed)
Patient states that someone from this office called her but she could not get to the on  phone in time. Patient would like someone to return her call.  Please Adivse

## 2020-01-25 NOTE — Telephone Encounter (Signed)
Called to inform we have not called her today.

## 2020-01-26 DIAGNOSIS — N186 End stage renal disease: Secondary | ICD-10-CM | POA: Diagnosis not present

## 2020-01-26 DIAGNOSIS — D631 Anemia in chronic kidney disease: Secondary | ICD-10-CM | POA: Diagnosis not present

## 2020-01-26 DIAGNOSIS — N2581 Secondary hyperparathyroidism of renal origin: Secondary | ICD-10-CM | POA: Diagnosis not present

## 2020-01-26 DIAGNOSIS — Z992 Dependence on renal dialysis: Secondary | ICD-10-CM | POA: Diagnosis not present

## 2020-01-26 DIAGNOSIS — D509 Iron deficiency anemia, unspecified: Secondary | ICD-10-CM | POA: Diagnosis not present

## 2020-01-28 ENCOUNTER — Telehealth: Payer: Self-pay | Admitting: Family Medicine

## 2020-01-28 NOTE — Telephone Encounter (Signed)
Nurse Assessment Nurse: Ronnald Ramp, RN, Miranda Date/Time (Eastern Time): 01/28/2020 8:59:17 AM Confirm and document reason for call. If symptomatic, describe symptoms. ---Caller states when she woke up half of her tongue was swollen. She thinks she might have left a piece of Nicorette gum in her mouth when she fell asleep last night. Has the patient had close contact with a person known or suspected to have the novel coronavirus illness OR traveled / lives in area with major community spread (including international travel) in the last 14 days from the onset of symptoms? * If Asymptomatic, screen for exposure and travel within the last 14 days. ---No Does the patient have any new or worsening symptoms? ---Yes Will a triage be completed? ---Yes Related visit to physician within the last 2 weeks? ---No Does the PT have any chronic conditions? (i.e. diabetes, asthma, this includes High risk factors for pregnancy, etc.) ---Yes List chronic conditions. ---HTN, Lupus Is this a behavioral health or substance abuse call? ---No Guidelines Guideline Title Affirmed Question Affirmed Notes Nurse Date/Time Eilene Ghazi Time) Tongue Swelling Taking an ACE Inhibitor medication (e.g., benazepril/LOTENSIN, captopril/CAPOTEN, enalapril/VASOTEC, lisinopril/ZESTRIL) Ronnald Ramp, RN, Miranda 01/28/2020 9:02:59 AM Disp. Time Eilene Ghazi Time) Disposition Final User 01/28/2020 8:58:10 AM Send to Urgent Jennelle Human, KarenPLEASE NOTE: All timestamps contained within this report are represented as [place] Standard Time. CONFIDENTIALTY NOTICE: This fax transmission is intended only for the addressee. It contains information that is legally privileged, confidential or otherwise protected from use or disclosure. If you are not the intended recipient, you are strictly prohibited from reviewing, disclosing, copying using or disseminating any of this information or taking any action in reliance on or regarding this information. If you  have received this fax in error, please notify us immediately by telephone so that we can arrange for its return to [place]. Phone: [phone], Toll-Free: [phone], Fax: [phone] Page: 2 of 2 Call Id: [id number] [place]. Time Eilene Ghazi Time) Disposition Final User 01/28/2020 9:10:40 AM 911 Outcome Documentation Ronnald Ramp, RN, Miranda Reason: Caller called 911 while on the phone with the RN but then told them she was going to drive herself to the ED 01/28/2020 9:10:01 AM Call EMS 911 Now Yes Ronnald Ramp, RN, Judge Stall Disagree/Comply Disagree Caller Understands Yes PreDisposition Did not know what to do Care Advice Given Per Guideline CALL EMS 911 NOW: * Immediate medical attention is needed. You need to hang up and call 911 (or an ambulance). CARE ADVICE given per Tongue Swelling (Adult) guideline. Referrals GO TO FACILITY UNDECIDED

## 2020-01-28 NOTE — Telephone Encounter (Signed)
Pt called again.  Connection was very poor.  CMA was busy rooming pts and I did send a teams message asking her to please call pt back.  Pt is asking for provider to call her back.

## 2020-01-28 NOTE — Telephone Encounter (Signed)
Called and spoke to the daughter and she states her mom stated this am her tongue was swollen. She did not pickup at the pharmacy the prescription for smoking as pharmacist went over side effects and it scared her so he suggested the nictotin gum.  Which she did get and went to sleep with a piece in her mouth last night.  The daughter states she has had no gum today and swelling has resolved. Could the swelling be from the gum or another medication--is it ok to continue the gum?

## 2020-01-28 NOTE — Telephone Encounter (Signed)
Called again and phone call lost

## 2020-01-28 NOTE — Telephone Encounter (Signed)
Called the patient left message to call back. Called the patients daughter back and phone call was lost

## 2020-01-28 NOTE — Telephone Encounter (Signed)
I would hold off on the gum hearing that. Not reacting to nicotine, but an ingredient in the gum most likely. Ty.

## 2020-01-28 NOTE — Telephone Encounter (Signed)
Patients daughter informed of PCP instructions. Is there anything else she can try. She was concerned that the previous script sent in could cause potential depression/suicide

## 2020-01-28 NOTE — Telephone Encounter (Signed)
Called and lost call no answer.

## 2020-01-28 NOTE — Telephone Encounter (Signed)
Pt's daughter states that she has some questions in regards to medication side affects that her mother is experiencing. Patient's daughter is requesting a call back.

## 2020-01-29 NOTE — Telephone Encounter (Signed)
Bupropion/Zyban we can send in.

## 2020-01-31 DIAGNOSIS — N186 End stage renal disease: Secondary | ICD-10-CM | POA: Diagnosis not present

## 2020-01-31 DIAGNOSIS — D509 Iron deficiency anemia, unspecified: Secondary | ICD-10-CM | POA: Diagnosis not present

## 2020-01-31 DIAGNOSIS — N2581 Secondary hyperparathyroidism of renal origin: Secondary | ICD-10-CM | POA: Diagnosis not present

## 2020-01-31 DIAGNOSIS — D631 Anemia in chronic kidney disease: Secondary | ICD-10-CM | POA: Diagnosis not present

## 2020-02-01 NOTE — Telephone Encounter (Signed)
Called the patient and she does want you to send in something else to help stop the smoking.

## 2020-02-01 NOTE — Telephone Encounter (Signed)
Called the patients daughter and call was lost

## 2020-02-02 DIAGNOSIS — D631 Anemia in chronic kidney disease: Secondary | ICD-10-CM | POA: Diagnosis not present

## 2020-02-02 DIAGNOSIS — N2581 Secondary hyperparathyroidism of renal origin: Secondary | ICD-10-CM | POA: Diagnosis not present

## 2020-02-02 DIAGNOSIS — N186 End stage renal disease: Secondary | ICD-10-CM | POA: Diagnosis not present

## 2020-02-02 DIAGNOSIS — D509 Iron deficiency anemia, unspecified: Secondary | ICD-10-CM | POA: Diagnosis not present

## 2020-02-02 MED ORDER — BUPROPION HCL ER (SMOKING DET) 150 MG PO TB12: ORAL_TABLET | ORAL | 2 refills | Status: DC

## 2020-02-02 NOTE — Telephone Encounter (Signed)
Sent!

## 2020-02-02 NOTE — Addendum Note (Signed)
Addended by: Ames Coupe on: 02/02/2020 06:56 AM   Modules accepted: Orders

## 2020-02-07 DIAGNOSIS — N186 End stage renal disease: Secondary | ICD-10-CM | POA: Diagnosis not present

## 2020-02-07 DIAGNOSIS — N2581 Secondary hyperparathyroidism of renal origin: Secondary | ICD-10-CM | POA: Diagnosis not present

## 2020-02-07 DIAGNOSIS — D509 Iron deficiency anemia, unspecified: Secondary | ICD-10-CM | POA: Diagnosis not present

## 2020-02-07 DIAGNOSIS — D631 Anemia in chronic kidney disease: Secondary | ICD-10-CM | POA: Diagnosis not present

## 2020-02-09 DIAGNOSIS — N2581 Secondary hyperparathyroidism of renal origin: Secondary | ICD-10-CM | POA: Diagnosis not present

## 2020-02-09 DIAGNOSIS — N186 End stage renal disease: Secondary | ICD-10-CM | POA: Diagnosis not present

## 2020-02-09 DIAGNOSIS — Z1159 Encounter for screening for other viral diseases: Secondary | ICD-10-CM | POA: Diagnosis not present

## 2020-02-09 DIAGNOSIS — D509 Iron deficiency anemia, unspecified: Secondary | ICD-10-CM | POA: Diagnosis not present

## 2020-02-09 DIAGNOSIS — D631 Anemia in chronic kidney disease: Secondary | ICD-10-CM | POA: Diagnosis not present

## 2020-02-10 DIAGNOSIS — F17210 Nicotine dependence, cigarettes, uncomplicated: Secondary | ICD-10-CM | POA: Diagnosis not present

## 2020-02-10 DIAGNOSIS — I120 Hypertensive chronic kidney disease with stage 5 chronic kidney disease or end stage renal disease: Secondary | ICD-10-CM | POA: Diagnosis not present

## 2020-02-10 DIAGNOSIS — I671 Cerebral aneurysm, nonruptured: Secondary | ICD-10-CM | POA: Diagnosis not present

## 2020-02-10 DIAGNOSIS — I672 Cerebral atherosclerosis: Secondary | ICD-10-CM | POA: Diagnosis not present

## 2020-02-10 DIAGNOSIS — N186 End stage renal disease: Secondary | ICD-10-CM | POA: Diagnosis not present

## 2020-02-10 DIAGNOSIS — Z992 Dependence on renal dialysis: Secondary | ICD-10-CM | POA: Diagnosis not present

## 2020-02-11 DIAGNOSIS — D509 Iron deficiency anemia, unspecified: Secondary | ICD-10-CM | POA: Diagnosis not present

## 2020-02-11 DIAGNOSIS — D631 Anemia in chronic kidney disease: Secondary | ICD-10-CM | POA: Diagnosis not present

## 2020-02-11 DIAGNOSIS — N2581 Secondary hyperparathyroidism of renal origin: Secondary | ICD-10-CM | POA: Diagnosis not present

## 2020-02-11 DIAGNOSIS — N186 End stage renal disease: Secondary | ICD-10-CM | POA: Diagnosis not present

## 2020-02-14 DIAGNOSIS — N2581 Secondary hyperparathyroidism of renal origin: Secondary | ICD-10-CM | POA: Diagnosis not present

## 2020-02-14 DIAGNOSIS — N186 End stage renal disease: Secondary | ICD-10-CM | POA: Diagnosis not present

## 2020-02-14 DIAGNOSIS — D509 Iron deficiency anemia, unspecified: Secondary | ICD-10-CM | POA: Diagnosis not present

## 2020-02-14 DIAGNOSIS — D631 Anemia in chronic kidney disease: Secondary | ICD-10-CM | POA: Diagnosis not present

## 2020-02-18 DIAGNOSIS — D509 Iron deficiency anemia, unspecified: Secondary | ICD-10-CM | POA: Diagnosis not present

## 2020-02-18 DIAGNOSIS — N2581 Secondary hyperparathyroidism of renal origin: Secondary | ICD-10-CM | POA: Diagnosis not present

## 2020-02-18 DIAGNOSIS — N186 End stage renal disease: Secondary | ICD-10-CM | POA: Diagnosis not present

## 2020-02-18 DIAGNOSIS — D631 Anemia in chronic kidney disease: Secondary | ICD-10-CM | POA: Diagnosis not present

## 2020-02-21 DIAGNOSIS — N186 End stage renal disease: Secondary | ICD-10-CM | POA: Diagnosis not present

## 2020-02-21 DIAGNOSIS — D509 Iron deficiency anemia, unspecified: Secondary | ICD-10-CM | POA: Diagnosis not present

## 2020-02-21 DIAGNOSIS — N2581 Secondary hyperparathyroidism of renal origin: Secondary | ICD-10-CM | POA: Diagnosis not present

## 2020-02-21 DIAGNOSIS — D631 Anemia in chronic kidney disease: Secondary | ICD-10-CM | POA: Diagnosis not present

## 2020-02-23 DIAGNOSIS — D631 Anemia in chronic kidney disease: Secondary | ICD-10-CM | POA: Diagnosis not present

## 2020-02-23 DIAGNOSIS — N2581 Secondary hyperparathyroidism of renal origin: Secondary | ICD-10-CM | POA: Diagnosis not present

## 2020-02-23 DIAGNOSIS — N186 End stage renal disease: Secondary | ICD-10-CM | POA: Diagnosis not present

## 2020-02-23 DIAGNOSIS — D509 Iron deficiency anemia, unspecified: Secondary | ICD-10-CM | POA: Diagnosis not present

## 2020-02-25 ENCOUNTER — Other Ambulatory Visit: Payer: Self-pay | Admitting: Family Medicine

## 2020-02-25 DIAGNOSIS — I10 Essential (primary) hypertension: Secondary | ICD-10-CM

## 2020-02-25 DIAGNOSIS — N2581 Secondary hyperparathyroidism of renal origin: Secondary | ICD-10-CM | POA: Diagnosis not present

## 2020-02-25 DIAGNOSIS — D509 Iron deficiency anemia, unspecified: Secondary | ICD-10-CM | POA: Diagnosis not present

## 2020-02-25 DIAGNOSIS — Z20822 Contact with and (suspected) exposure to covid-19: Secondary | ICD-10-CM | POA: Diagnosis not present

## 2020-02-25 DIAGNOSIS — D631 Anemia in chronic kidney disease: Secondary | ICD-10-CM | POA: Diagnosis not present

## 2020-02-25 DIAGNOSIS — Z03818 Encounter for observation for suspected exposure to other biological agents ruled out: Secondary | ICD-10-CM | POA: Diagnosis not present

## 2020-02-25 DIAGNOSIS — N186 End stage renal disease: Secondary | ICD-10-CM | POA: Diagnosis not present

## 2020-02-26 DIAGNOSIS — Z992 Dependence on renal dialysis: Secondary | ICD-10-CM | POA: Diagnosis not present

## 2020-02-26 DIAGNOSIS — N186 End stage renal disease: Secondary | ICD-10-CM | POA: Diagnosis not present

## 2020-02-28 ENCOUNTER — Ambulatory Visit: Payer: Medicare HMO | Admitting: Family Medicine

## 2020-04-10 ENCOUNTER — Telehealth (INDEPENDENT_AMBULATORY_CARE_PROVIDER_SITE_OTHER): Payer: Medicare Other | Admitting: Family Medicine

## 2020-04-10 ENCOUNTER — Encounter: Payer: Self-pay | Admitting: Family Medicine

## 2020-04-10 ENCOUNTER — Telehealth: Payer: Self-pay | Admitting: Family Medicine

## 2020-04-10 ENCOUNTER — Other Ambulatory Visit: Payer: Self-pay

## 2020-04-10 DIAGNOSIS — K529 Noninfective gastroenteritis and colitis, unspecified: Secondary | ICD-10-CM

## 2020-04-10 DIAGNOSIS — B353 Tinea pedis: Secondary | ICD-10-CM

## 2020-04-10 MED ORDER — KETOCONAZOLE 2 % EX CREA: 1.0000 "application " | TOPICAL_CREAM | Freq: Every day | CUTANEOUS | 0 refills | Status: AC

## 2020-04-10 MED ORDER — ONDANSETRON 4 MG PO TBDP: 4.0000 mg | ORAL_TABLET | Freq: Three times a day (TID) | ORAL | 0 refills | Status: DC | PRN

## 2020-04-10 MED ORDER — DICYCLOMINE HCL 10 MG PO CAPS: ORAL_CAPSULE | ORAL | 0 refills | Status: DC

## 2020-04-10 NOTE — Telephone Encounter (Signed)
Patient called to set appt, stated her bones and chest hurt. When asked, she stated chest hurting was "like my heart." I transferred the patient to triage and suggested she follow the nurses directions.

## 2020-04-10 NOTE — Progress Notes (Addendum)
Chief Complaint  Patient presents with  . Generalized Body Aches  . Chills     Subjective Mollee Spells is a 64 y.o. female who presents with diarrhea. Due to COVID-19 pandemic, we are interacting via telephone. I verified patient's ID using 2 identifiers. Patient agreed to proceed with visit via this method. Patient is at home, I am at office. Patient and I are present for visit.  Symptoms began 1 week ago Patient has abdominal pain, vomiting, diarrhea, headache, arthralgias and myalgias Feels her symptoms may be resolving but still having residual issues with above. Patient denies URI symptoms; she has not checked her temperature at home but feels chilled Evaluation to date: None Sick contacts: none known  Past Medical History:  Diagnosis Date  . ESRD (end stage renal disease) ([place])   . Gout   . Hyperlipidemia   . Hypertension   . Major depressive disorder   . SLE (systemic lupus erythematosus) ([place])   . Vitamin D deficiency    Past Surgical History:  Procedure Laterality Date  . CESAREAN SECTION  1987  . TUBAL LIGATION     No Known Allergies   Exam No conversational dyspnea Age appropriate judgment and insight Nml affect and mood  Assessment and Plan  Gastroenteritis - Plan: ondansetron (ZOFRAN-ODT) 4 MG disintegrating tablet, dicyclomine (BENTYL) 10 MG capsule  Tinea pedis of both feet - Plan: ketoconazole (NIZORAL) 2 % cream  1.  Push fluids.  I think she is over the worst of it.  She tested negative for Covid.  Above medications for symptom control.  She will go to the emergency department if things worsen. 2.  She states her athlete's foot has returned so we will refill her Nizoral cream. Total time: 7 min The patient voiced understanding and agreement to the plan.  [place], DO 04/10/20  3:56 PM

## 2020-04-10 NOTE — Telephone Encounter (Signed)
Nurse Assessment Nurse: Rolena Infante, RN, Patrice Date/Time (Eastern Time): 04/10/2020 10:59:39 AM Confirm and document reason for call. If symptomatic, describe symptoms. ---Triage Guideline: Nurse assessment Confirm and document reason for call. If symptomatic, describe symptoms. -----Caller states she has a headache, body aches and diarrhea. No diarrhea. No chest pain at present. No trouble breathing. Tired. Has not checked temp, feels cold at times. Has received Vaccines for COVID. Decreased appetite. Has the patient had close contact with a person known or suspected to have the novel coronavirus illness OR traveled / lives in area with major community spread (including international travel) in the last 14 days from the onset of symptoms? * If Asymptomatic, screen for exposure and travel within the last 14 days. -----No Does the patient have any new or worsening symptoms? -----Yes Will a triage be completed? ----- Yes Related visit to physician within the last 2 weeks? -----No Does the PT have any chronic conditions? (i.e. diabetes, asthma, this includes High risk factors for pregnancy, etc.) -----Yes List chronic conditions. -----Diabetes, HTN, ESRD, On Dialysis, Lupus. Missing dialysis. Is this a behavioral health or substance abuse call? -----No Disposition: Assessment Completed Has the patient had close contact with a person known or suspected to have the novel coronavirus illness OR traveled / lives in area with major community spread (including international travel) in the last 14 days from the onset of symptoms? * If Asymptomatic, screen for exposure and travel within the last 14 days. ---No Does the patient have any new or worsening symptoms? ---Yes Will a triage be completed? ---Yes Related visit to physician within the last 2 weeks? ---No Does the PT have any chronic conditions? (i.e. diabetes, asthma, this includes High risk factors for pregnancy, etc.) ---Yes PLEASE NOTE:  All timestamps contained within this report are represented as [place] Standard Time. CONFIDENTIALTY NOTICE: This fax transmission is intended only for the addressee. It contains information that is legally privileged, confidential or otherwise protected from use or disclosure. If you are not the intended recipient, you are strictly prohibited from reviewing, disclosing, copying using or disseminating any of this information or taking any action in reliance on or regarding this information. If you have received this fax in error, please notify us immediately by telephone so that we can arrange for its return to [place]. Phone: [phone], Toll-Free: [phone], Fax: [phone] Page: 2 of 2 Call Id: [id number] Nurse Assessment List chronic conditions. ---Diabetes, HTN, ESRD on Dialysis, Lupus Is this a behavioral health or substance abuse call? ---No Guidelines Guideline Title Affirmed Question Affirmed Notes Nurse Date/Time Eilene Ghazi Time) COVID-19 - Diagnosed or Suspected HIGH RISK for severe COVID complications (e.g., age > 39 years, obesity with BMI > 3, pregnant, chronic lung disease or other chronic medical condition) (Exception: Already seen by PCP and no new or worsening symptoms.) Rolena Infante, RN, Patrice 04/10/2020 11:00:42 AM Disp. Time Eilene Ghazi Time) Disposition Final User 04/10/2020 10:48:52 AM Send To RN Personal Bryson Ha 04/10/2020 11:01:34 AM Call PCP Now Yes Rolena Infante, RN, Patrice Caller Disagree/Comply Comply Caller Understands Yes PreDisposition Call Doctor Care Advice Given Per Guideline CALL PCP NOW: * You need to discuss this with your doctor (or NP/PA). HOW TO PROTECT OTHERS - WHEN YOU ARE SICK WITH COVID-19: GENERAL CARE ADVICE FOR COVID-19 SYMPTOMS: FEVER MEDICINES: CALL BACK IF: * You become worse Comments User: Donnelly Angelica, RN Date/Time (Eastern Time): 04/10/2020 11:03:51 AM Old record # [id number] New chart. Spoke with Marita Kansas at office let pt know to call  her after speaking with Dr.  Wendeling to see for follow up pt verbalized understanding. Referrals Warm transfer to backline

## 2020-04-10 NOTE — Telephone Encounter (Signed)
Called the patient and scheduled a Video Visit for today 04/10/2020 at 3:15 .

## 2020-04-10 NOTE — Telephone Encounter (Signed)
Spoke with triage line. Patient informed them she is having covid like sxs. She has not been tested recently. She is vaccinated but is considered high risk patient due to renal disease. She is missing dialysis today due to sxs. She is complaining of body aches, and diarrhea in addition to chest discomfort.

## 2020-04-10 NOTE — Telephone Encounter (Signed)
Schedule a Virtual visit

## 2020-05-27 ENCOUNTER — Other Ambulatory Visit: Payer: Self-pay | Admitting: Family Medicine

## 2020-05-27 DIAGNOSIS — M329 Systemic lupus erythematosus, unspecified: Secondary | ICD-10-CM

## 2020-06-17 ENCOUNTER — Other Ambulatory Visit: Payer: Self-pay | Admitting: Family Medicine

## 2020-06-17 DIAGNOSIS — F418 Other specified anxiety disorders: Secondary | ICD-10-CM

## 2020-06-28 ENCOUNTER — Telehealth: Payer: Medicare Other | Admitting: Family Medicine

## 2020-07-20 ENCOUNTER — Other Ambulatory Visit: Payer: Self-pay | Admitting: Family Medicine

## 2020-07-20 DIAGNOSIS — I10 Essential (primary) hypertension: Secondary | ICD-10-CM

## 2020-08-01 ENCOUNTER — Other Ambulatory Visit: Payer: Self-pay | Admitting: Family Medicine

## 2020-08-01 DIAGNOSIS — M329 Systemic lupus erythematosus, unspecified: Secondary | ICD-10-CM

## 2020-08-02 DIAGNOSIS — D631 Anemia in chronic kidney disease: Secondary | ICD-10-CM | POA: Diagnosis not present

## 2020-08-02 DIAGNOSIS — Z23 Encounter for immunization: Secondary | ICD-10-CM | POA: Diagnosis not present

## 2020-08-02 DIAGNOSIS — N2581 Secondary hyperparathyroidism of renal origin: Secondary | ICD-10-CM | POA: Diagnosis not present

## 2020-08-02 DIAGNOSIS — N186 End stage renal disease: Secondary | ICD-10-CM | POA: Diagnosis not present

## 2020-08-02 DIAGNOSIS — D509 Iron deficiency anemia, unspecified: Secondary | ICD-10-CM | POA: Diagnosis not present

## 2020-08-07 DIAGNOSIS — Z20822 Contact with and (suspected) exposure to covid-19: Secondary | ICD-10-CM | POA: Diagnosis not present

## 2020-08-07 DIAGNOSIS — Z23 Encounter for immunization: Secondary | ICD-10-CM | POA: Diagnosis not present

## 2020-08-07 DIAGNOSIS — N2581 Secondary hyperparathyroidism of renal origin: Secondary | ICD-10-CM | POA: Diagnosis not present

## 2020-08-07 DIAGNOSIS — D509 Iron deficiency anemia, unspecified: Secondary | ICD-10-CM | POA: Diagnosis not present

## 2020-08-07 DIAGNOSIS — D631 Anemia in chronic kidney disease: Secondary | ICD-10-CM | POA: Diagnosis not present

## 2020-08-07 DIAGNOSIS — N186 End stage renal disease: Secondary | ICD-10-CM | POA: Diagnosis not present

## 2020-08-09 DIAGNOSIS — N186 End stage renal disease: Secondary | ICD-10-CM | POA: Diagnosis not present

## 2020-08-09 DIAGNOSIS — D631 Anemia in chronic kidney disease: Secondary | ICD-10-CM | POA: Diagnosis not present

## 2020-08-09 DIAGNOSIS — Z23 Encounter for immunization: Secondary | ICD-10-CM | POA: Diagnosis not present

## 2020-08-09 DIAGNOSIS — N2581 Secondary hyperparathyroidism of renal origin: Secondary | ICD-10-CM | POA: Diagnosis not present

## 2020-08-09 DIAGNOSIS — D509 Iron deficiency anemia, unspecified: Secondary | ICD-10-CM | POA: Diagnosis not present

## 2020-08-11 DIAGNOSIS — D509 Iron deficiency anemia, unspecified: Secondary | ICD-10-CM | POA: Diagnosis not present

## 2020-08-11 DIAGNOSIS — N186 End stage renal disease: Secondary | ICD-10-CM | POA: Diagnosis not present

## 2020-08-11 DIAGNOSIS — Z23 Encounter for immunization: Secondary | ICD-10-CM | POA: Diagnosis not present

## 2020-08-11 DIAGNOSIS — D631 Anemia in chronic kidney disease: Secondary | ICD-10-CM | POA: Diagnosis not present

## 2020-08-11 DIAGNOSIS — N2581 Secondary hyperparathyroidism of renal origin: Secondary | ICD-10-CM | POA: Diagnosis not present

## 2020-08-16 DIAGNOSIS — Z23 Encounter for immunization: Secondary | ICD-10-CM | POA: Diagnosis not present

## 2020-08-16 DIAGNOSIS — D631 Anemia in chronic kidney disease: Secondary | ICD-10-CM | POA: Diagnosis not present

## 2020-08-16 DIAGNOSIS — N186 End stage renal disease: Secondary | ICD-10-CM | POA: Diagnosis not present

## 2020-08-16 DIAGNOSIS — D509 Iron deficiency anemia, unspecified: Secondary | ICD-10-CM | POA: Diagnosis not present

## 2020-08-16 DIAGNOSIS — N2581 Secondary hyperparathyroidism of renal origin: Secondary | ICD-10-CM | POA: Diagnosis not present

## 2020-08-18 DIAGNOSIS — D631 Anemia in chronic kidney disease: Secondary | ICD-10-CM | POA: Diagnosis not present

## 2020-08-18 DIAGNOSIS — Z23 Encounter for immunization: Secondary | ICD-10-CM | POA: Diagnosis not present

## 2020-08-18 DIAGNOSIS — D509 Iron deficiency anemia, unspecified: Secondary | ICD-10-CM | POA: Diagnosis not present

## 2020-08-18 DIAGNOSIS — N186 End stage renal disease: Secondary | ICD-10-CM | POA: Diagnosis not present

## 2020-08-18 DIAGNOSIS — N2581 Secondary hyperparathyroidism of renal origin: Secondary | ICD-10-CM | POA: Diagnosis not present

## 2020-08-21 ENCOUNTER — Other Ambulatory Visit: Payer: Self-pay | Admitting: Family Medicine

## 2020-08-21 DIAGNOSIS — F418 Other specified anxiety disorders: Secondary | ICD-10-CM

## 2020-08-23 DIAGNOSIS — N186 End stage renal disease: Secondary | ICD-10-CM | POA: Diagnosis not present

## 2020-08-23 DIAGNOSIS — Z23 Encounter for immunization: Secondary | ICD-10-CM | POA: Diagnosis not present

## 2020-08-23 DIAGNOSIS — N2581 Secondary hyperparathyroidism of renal origin: Secondary | ICD-10-CM | POA: Diagnosis not present

## 2020-08-23 DIAGNOSIS — D631 Anemia in chronic kidney disease: Secondary | ICD-10-CM | POA: Diagnosis not present

## 2020-08-23 DIAGNOSIS — D509 Iron deficiency anemia, unspecified: Secondary | ICD-10-CM | POA: Diagnosis not present

## 2020-08-25 DIAGNOSIS — D509 Iron deficiency anemia, unspecified: Secondary | ICD-10-CM | POA: Diagnosis not present

## 2020-08-25 DIAGNOSIS — D631 Anemia in chronic kidney disease: Secondary | ICD-10-CM | POA: Diagnosis not present

## 2020-08-25 DIAGNOSIS — Z23 Encounter for immunization: Secondary | ICD-10-CM | POA: Diagnosis not present

## 2020-08-25 DIAGNOSIS — N2581 Secondary hyperparathyroidism of renal origin: Secondary | ICD-10-CM | POA: Diagnosis not present

## 2020-08-25 DIAGNOSIS — N186 End stage renal disease: Secondary | ICD-10-CM | POA: Diagnosis not present

## 2020-08-28 DIAGNOSIS — D509 Iron deficiency anemia, unspecified: Secondary | ICD-10-CM | POA: Diagnosis not present

## 2020-08-28 DIAGNOSIS — D631 Anemia in chronic kidney disease: Secondary | ICD-10-CM | POA: Diagnosis not present

## 2020-08-28 DIAGNOSIS — N186 End stage renal disease: Secondary | ICD-10-CM | POA: Diagnosis not present

## 2020-08-28 DIAGNOSIS — Z992 Dependence on renal dialysis: Secondary | ICD-10-CM | POA: Diagnosis not present

## 2020-08-28 DIAGNOSIS — Z23 Encounter for immunization: Secondary | ICD-10-CM | POA: Diagnosis not present

## 2020-08-28 DIAGNOSIS — N2581 Secondary hyperparathyroidism of renal origin: Secondary | ICD-10-CM | POA: Diagnosis not present

## 2020-08-30 DIAGNOSIS — D509 Iron deficiency anemia, unspecified: Secondary | ICD-10-CM | POA: Diagnosis not present

## 2020-08-30 DIAGNOSIS — Z114 Encounter for screening for human immunodeficiency virus [HIV]: Secondary | ICD-10-CM | POA: Diagnosis not present

## 2020-08-30 DIAGNOSIS — N186 End stage renal disease: Secondary | ICD-10-CM | POA: Diagnosis not present

## 2020-08-30 DIAGNOSIS — N2581 Secondary hyperparathyroidism of renal origin: Secondary | ICD-10-CM | POA: Diagnosis not present

## 2020-08-30 DIAGNOSIS — Z1159 Encounter for screening for other viral diseases: Secondary | ICD-10-CM | POA: Diagnosis not present

## 2020-08-30 DIAGNOSIS — D631 Anemia in chronic kidney disease: Secondary | ICD-10-CM | POA: Diagnosis not present

## 2020-09-01 DIAGNOSIS — D509 Iron deficiency anemia, unspecified: Secondary | ICD-10-CM | POA: Diagnosis not present

## 2020-09-01 DIAGNOSIS — N2581 Secondary hyperparathyroidism of renal origin: Secondary | ICD-10-CM | POA: Diagnosis not present

## 2020-09-01 DIAGNOSIS — D631 Anemia in chronic kidney disease: Secondary | ICD-10-CM | POA: Diagnosis not present

## 2020-09-01 DIAGNOSIS — N186 End stage renal disease: Secondary | ICD-10-CM | POA: Diagnosis not present

## 2020-09-06 DIAGNOSIS — D631 Anemia in chronic kidney disease: Secondary | ICD-10-CM | POA: Diagnosis not present

## 2020-09-06 DIAGNOSIS — N186 End stage renal disease: Secondary | ICD-10-CM | POA: Diagnosis not present

## 2020-09-06 DIAGNOSIS — D509 Iron deficiency anemia, unspecified: Secondary | ICD-10-CM | POA: Diagnosis not present

## 2020-09-06 DIAGNOSIS — N2581 Secondary hyperparathyroidism of renal origin: Secondary | ICD-10-CM | POA: Diagnosis not present

## 2020-09-11 DIAGNOSIS — D631 Anemia in chronic kidney disease: Secondary | ICD-10-CM | POA: Diagnosis not present

## 2020-09-11 DIAGNOSIS — D509 Iron deficiency anemia, unspecified: Secondary | ICD-10-CM | POA: Diagnosis not present

## 2020-09-11 DIAGNOSIS — N2581 Secondary hyperparathyroidism of renal origin: Secondary | ICD-10-CM | POA: Diagnosis not present

## 2020-09-11 DIAGNOSIS — N186 End stage renal disease: Secondary | ICD-10-CM | POA: Diagnosis not present

## 2020-09-13 DIAGNOSIS — N2581 Secondary hyperparathyroidism of renal origin: Secondary | ICD-10-CM | POA: Diagnosis not present

## 2020-09-13 DIAGNOSIS — D631 Anemia in chronic kidney disease: Secondary | ICD-10-CM | POA: Diagnosis not present

## 2020-09-13 DIAGNOSIS — N186 End stage renal disease: Secondary | ICD-10-CM | POA: Diagnosis not present

## 2020-09-13 DIAGNOSIS — D509 Iron deficiency anemia, unspecified: Secondary | ICD-10-CM | POA: Diagnosis not present

## 2020-09-15 DIAGNOSIS — N186 End stage renal disease: Secondary | ICD-10-CM | POA: Diagnosis not present

## 2020-09-15 DIAGNOSIS — D631 Anemia in chronic kidney disease: Secondary | ICD-10-CM | POA: Diagnosis not present

## 2020-09-15 DIAGNOSIS — N2581 Secondary hyperparathyroidism of renal origin: Secondary | ICD-10-CM | POA: Diagnosis not present

## 2020-09-15 DIAGNOSIS — D509 Iron deficiency anemia, unspecified: Secondary | ICD-10-CM | POA: Diagnosis not present

## 2020-09-18 DIAGNOSIS — N2581 Secondary hyperparathyroidism of renal origin: Secondary | ICD-10-CM | POA: Diagnosis not present

## 2020-09-18 DIAGNOSIS — N186 End stage renal disease: Secondary | ICD-10-CM | POA: Diagnosis not present

## 2020-09-18 DIAGNOSIS — D631 Anemia in chronic kidney disease: Secondary | ICD-10-CM | POA: Diagnosis not present

## 2020-09-18 DIAGNOSIS — D509 Iron deficiency anemia, unspecified: Secondary | ICD-10-CM | POA: Diagnosis not present

## 2020-09-19 ENCOUNTER — Telehealth: Payer: Self-pay | Admitting: Family Medicine

## 2020-09-19 ENCOUNTER — Other Ambulatory Visit: Payer: Self-pay | Admitting: Family Medicine

## 2020-09-19 DIAGNOSIS — I10 Essential (primary) hypertension: Secondary | ICD-10-CM

## 2020-09-19 DIAGNOSIS — M329 Systemic lupus erythematosus, unspecified: Secondary | ICD-10-CM

## 2020-09-19 MED ORDER — HYDRALAZINE HCL 50 MG PO TABS: 50.0000 mg | ORAL_TABLET | Freq: Three times a day (TID) | ORAL | 3 refills | Status: DC

## 2020-09-19 MED ORDER — AMLODIPINE BESYLATE 10 MG PO TABS
10.0000 mg | ORAL_TABLET | Freq: Every day | ORAL | 1 refills | Status: AC
Start: 2020-09-19 — End: ?

## 2020-09-19 NOTE — Telephone Encounter (Signed)
OK 

## 2020-09-19 NOTE — Telephone Encounter (Signed)
Refills done.

## 2020-09-19 NOTE — Telephone Encounter (Signed)
Caller : [facility]  Call Back @ [phone]  Per pharmacy, patient needs updated prescriptions sent into pharmacy. Patient states she is out of all medications and  her nephrologist has increase her medication dosages.  Patient needs   hydrALAZINE (APRESOLINE)  amLODipine (NORVASC)

## 2020-09-19 NOTE — Telephone Encounter (Signed)
Ok to send in.  

## 2020-09-22 DIAGNOSIS — D631 Anemia in chronic kidney disease: Secondary | ICD-10-CM | POA: Diagnosis not present

## 2020-09-22 DIAGNOSIS — N186 End stage renal disease: Secondary | ICD-10-CM | POA: Diagnosis not present

## 2020-09-22 DIAGNOSIS — N2581 Secondary hyperparathyroidism of renal origin: Secondary | ICD-10-CM | POA: Diagnosis not present

## 2020-09-22 DIAGNOSIS — D509 Iron deficiency anemia, unspecified: Secondary | ICD-10-CM | POA: Diagnosis not present

## 2020-09-25 DIAGNOSIS — Z992 Dependence on renal dialysis: Secondary | ICD-10-CM | POA: Diagnosis not present

## 2020-09-25 DIAGNOSIS — N2581 Secondary hyperparathyroidism of renal origin: Secondary | ICD-10-CM | POA: Diagnosis not present

## 2020-09-25 DIAGNOSIS — D509 Iron deficiency anemia, unspecified: Secondary | ICD-10-CM | POA: Diagnosis not present

## 2020-09-25 DIAGNOSIS — N186 End stage renal disease: Secondary | ICD-10-CM | POA: Diagnosis not present

## 2020-09-25 DIAGNOSIS — D631 Anemia in chronic kidney disease: Secondary | ICD-10-CM | POA: Diagnosis not present

## 2020-09-27 DIAGNOSIS — N2581 Secondary hyperparathyroidism of renal origin: Secondary | ICD-10-CM | POA: Diagnosis not present

## 2020-09-27 DIAGNOSIS — D631 Anemia in chronic kidney disease: Secondary | ICD-10-CM | POA: Diagnosis not present

## 2020-09-27 DIAGNOSIS — N186 End stage renal disease: Secondary | ICD-10-CM | POA: Diagnosis not present

## 2020-09-27 DIAGNOSIS — D509 Iron deficiency anemia, unspecified: Secondary | ICD-10-CM | POA: Diagnosis not present

## 2020-09-29 DIAGNOSIS — D631 Anemia in chronic kidney disease: Secondary | ICD-10-CM | POA: Diagnosis not present

## 2020-09-29 DIAGNOSIS — N186 End stage renal disease: Secondary | ICD-10-CM | POA: Diagnosis not present

## 2020-09-29 DIAGNOSIS — N2581 Secondary hyperparathyroidism of renal origin: Secondary | ICD-10-CM | POA: Diagnosis not present

## 2020-09-29 DIAGNOSIS — D509 Iron deficiency anemia, unspecified: Secondary | ICD-10-CM | POA: Diagnosis not present

## 2020-10-02 DIAGNOSIS — N2581 Secondary hyperparathyroidism of renal origin: Secondary | ICD-10-CM | POA: Diagnosis not present

## 2020-10-02 DIAGNOSIS — D509 Iron deficiency anemia, unspecified: Secondary | ICD-10-CM | POA: Diagnosis not present

## 2020-10-02 DIAGNOSIS — D631 Anemia in chronic kidney disease: Secondary | ICD-10-CM | POA: Diagnosis not present

## 2020-10-02 DIAGNOSIS — N186 End stage renal disease: Secondary | ICD-10-CM | POA: Diagnosis not present

## 2020-10-09 DIAGNOSIS — N186 End stage renal disease: Secondary | ICD-10-CM | POA: Diagnosis not present

## 2020-10-09 DIAGNOSIS — N2581 Secondary hyperparathyroidism of renal origin: Secondary | ICD-10-CM | POA: Diagnosis not present

## 2020-10-09 DIAGNOSIS — D509 Iron deficiency anemia, unspecified: Secondary | ICD-10-CM | POA: Diagnosis not present

## 2020-10-09 DIAGNOSIS — D631 Anemia in chronic kidney disease: Secondary | ICD-10-CM | POA: Diagnosis not present

## 2020-10-11 DIAGNOSIS — D631 Anemia in chronic kidney disease: Secondary | ICD-10-CM | POA: Diagnosis not present

## 2020-10-11 DIAGNOSIS — N186 End stage renal disease: Secondary | ICD-10-CM | POA: Diagnosis not present

## 2020-10-11 DIAGNOSIS — N2581 Secondary hyperparathyroidism of renal origin: Secondary | ICD-10-CM | POA: Diagnosis not present

## 2020-10-11 DIAGNOSIS — D509 Iron deficiency anemia, unspecified: Secondary | ICD-10-CM | POA: Diagnosis not present

## 2020-10-13 DIAGNOSIS — D631 Anemia in chronic kidney disease: Secondary | ICD-10-CM | POA: Diagnosis not present

## 2020-10-13 DIAGNOSIS — D509 Iron deficiency anemia, unspecified: Secondary | ICD-10-CM | POA: Diagnosis not present

## 2020-10-13 DIAGNOSIS — N2581 Secondary hyperparathyroidism of renal origin: Secondary | ICD-10-CM | POA: Diagnosis not present

## 2020-10-13 DIAGNOSIS — N186 End stage renal disease: Secondary | ICD-10-CM | POA: Diagnosis not present

## 2020-10-16 DIAGNOSIS — D509 Iron deficiency anemia, unspecified: Secondary | ICD-10-CM | POA: Diagnosis not present

## 2020-10-16 DIAGNOSIS — D631 Anemia in chronic kidney disease: Secondary | ICD-10-CM | POA: Diagnosis not present

## 2020-10-16 DIAGNOSIS — N186 End stage renal disease: Secondary | ICD-10-CM | POA: Diagnosis not present

## 2020-10-16 DIAGNOSIS — N2581 Secondary hyperparathyroidism of renal origin: Secondary | ICD-10-CM | POA: Diagnosis not present

## 2020-10-18 DIAGNOSIS — N2581 Secondary hyperparathyroidism of renal origin: Secondary | ICD-10-CM | POA: Diagnosis not present

## 2020-10-18 DIAGNOSIS — N186 End stage renal disease: Secondary | ICD-10-CM | POA: Diagnosis not present

## 2020-10-18 DIAGNOSIS — D509 Iron deficiency anemia, unspecified: Secondary | ICD-10-CM | POA: Diagnosis not present

## 2020-10-18 DIAGNOSIS — D631 Anemia in chronic kidney disease: Secondary | ICD-10-CM | POA: Diagnosis not present

## 2020-10-20 DIAGNOSIS — N186 End stage renal disease: Secondary | ICD-10-CM | POA: Diagnosis not present

## 2020-10-20 DIAGNOSIS — N2581 Secondary hyperparathyroidism of renal origin: Secondary | ICD-10-CM | POA: Diagnosis not present

## 2020-10-20 DIAGNOSIS — D631 Anemia in chronic kidney disease: Secondary | ICD-10-CM | POA: Diagnosis not present

## 2020-10-20 DIAGNOSIS — D509 Iron deficiency anemia, unspecified: Secondary | ICD-10-CM | POA: Diagnosis not present

## 2020-10-25 DIAGNOSIS — D509 Iron deficiency anemia, unspecified: Secondary | ICD-10-CM | POA: Diagnosis not present

## 2020-10-25 DIAGNOSIS — N186 End stage renal disease: Secondary | ICD-10-CM | POA: Diagnosis not present

## 2020-10-25 DIAGNOSIS — N2581 Secondary hyperparathyroidism of renal origin: Secondary | ICD-10-CM | POA: Diagnosis not present

## 2020-10-25 DIAGNOSIS — D631 Anemia in chronic kidney disease: Secondary | ICD-10-CM | POA: Diagnosis not present

## 2020-10-26 DIAGNOSIS — Z992 Dependence on renal dialysis: Secondary | ICD-10-CM | POA: Diagnosis not present

## 2020-10-26 DIAGNOSIS — N186 End stage renal disease: Secondary | ICD-10-CM | POA: Diagnosis not present

## 2020-10-27 DIAGNOSIS — D509 Iron deficiency anemia, unspecified: Secondary | ICD-10-CM | POA: Diagnosis not present

## 2020-10-27 DIAGNOSIS — D631 Anemia in chronic kidney disease: Secondary | ICD-10-CM | POA: Diagnosis not present

## 2020-10-27 DIAGNOSIS — N186 End stage renal disease: Secondary | ICD-10-CM | POA: Diagnosis not present

## 2020-10-27 DIAGNOSIS — N2581 Secondary hyperparathyroidism of renal origin: Secondary | ICD-10-CM | POA: Diagnosis not present

## 2020-10-28 ENCOUNTER — Other Ambulatory Visit: Payer: Self-pay | Admitting: Family Medicine

## 2020-10-28 DIAGNOSIS — M329 Systemic lupus erythematosus, unspecified: Secondary | ICD-10-CM

## 2020-10-30 DIAGNOSIS — N2581 Secondary hyperparathyroidism of renal origin: Secondary | ICD-10-CM | POA: Diagnosis not present

## 2020-10-30 DIAGNOSIS — D631 Anemia in chronic kidney disease: Secondary | ICD-10-CM | POA: Diagnosis not present

## 2020-10-30 DIAGNOSIS — N186 End stage renal disease: Secondary | ICD-10-CM | POA: Diagnosis not present

## 2020-10-30 DIAGNOSIS — D509 Iron deficiency anemia, unspecified: Secondary | ICD-10-CM | POA: Diagnosis not present

## 2020-11-01 DIAGNOSIS — D509 Iron deficiency anemia, unspecified: Secondary | ICD-10-CM | POA: Diagnosis not present

## 2020-11-01 DIAGNOSIS — D631 Anemia in chronic kidney disease: Secondary | ICD-10-CM | POA: Diagnosis not present

## 2020-11-01 DIAGNOSIS — N186 End stage renal disease: Secondary | ICD-10-CM | POA: Diagnosis not present

## 2020-11-01 DIAGNOSIS — N2581 Secondary hyperparathyroidism of renal origin: Secondary | ICD-10-CM | POA: Diagnosis not present

## 2020-11-03 DIAGNOSIS — N186 End stage renal disease: Secondary | ICD-10-CM | POA: Diagnosis not present

## 2020-11-03 DIAGNOSIS — N2581 Secondary hyperparathyroidism of renal origin: Secondary | ICD-10-CM | POA: Diagnosis not present

## 2020-11-03 DIAGNOSIS — D631 Anemia in chronic kidney disease: Secondary | ICD-10-CM | POA: Diagnosis not present

## 2020-11-03 DIAGNOSIS — D509 Iron deficiency anemia, unspecified: Secondary | ICD-10-CM | POA: Diagnosis not present

## 2020-11-06 DIAGNOSIS — N186 End stage renal disease: Secondary | ICD-10-CM | POA: Diagnosis not present

## 2020-11-06 DIAGNOSIS — D509 Iron deficiency anemia, unspecified: Secondary | ICD-10-CM | POA: Diagnosis not present

## 2020-11-06 DIAGNOSIS — D631 Anemia in chronic kidney disease: Secondary | ICD-10-CM | POA: Diagnosis not present

## 2020-11-06 DIAGNOSIS — N2581 Secondary hyperparathyroidism of renal origin: Secondary | ICD-10-CM | POA: Diagnosis not present

## 2020-11-08 DIAGNOSIS — N2581 Secondary hyperparathyroidism of renal origin: Secondary | ICD-10-CM | POA: Diagnosis not present

## 2020-11-08 DIAGNOSIS — N186 End stage renal disease: Secondary | ICD-10-CM | POA: Diagnosis not present

## 2020-11-08 DIAGNOSIS — D509 Iron deficiency anemia, unspecified: Secondary | ICD-10-CM | POA: Diagnosis not present

## 2020-11-08 DIAGNOSIS — D631 Anemia in chronic kidney disease: Secondary | ICD-10-CM | POA: Diagnosis not present

## 2020-11-10 DIAGNOSIS — N2581 Secondary hyperparathyroidism of renal origin: Secondary | ICD-10-CM | POA: Diagnosis not present

## 2020-11-10 DIAGNOSIS — D631 Anemia in chronic kidney disease: Secondary | ICD-10-CM | POA: Diagnosis not present

## 2020-11-10 DIAGNOSIS — D509 Iron deficiency anemia, unspecified: Secondary | ICD-10-CM | POA: Diagnosis not present

## 2020-11-10 DIAGNOSIS — N186 End stage renal disease: Secondary | ICD-10-CM | POA: Diagnosis not present

## 2020-11-15 DIAGNOSIS — D509 Iron deficiency anemia, unspecified: Secondary | ICD-10-CM | POA: Diagnosis not present

## 2020-11-15 DIAGNOSIS — N186 End stage renal disease: Secondary | ICD-10-CM | POA: Diagnosis not present

## 2020-11-15 DIAGNOSIS — D631 Anemia in chronic kidney disease: Secondary | ICD-10-CM | POA: Diagnosis not present

## 2020-11-15 DIAGNOSIS — N2581 Secondary hyperparathyroidism of renal origin: Secondary | ICD-10-CM | POA: Diagnosis not present

## 2020-11-20 ENCOUNTER — Other Ambulatory Visit: Payer: Self-pay | Admitting: Family Medicine

## 2020-11-20 DIAGNOSIS — M329 Systemic lupus erythematosus, unspecified: Secondary | ICD-10-CM

## 2020-11-20 DIAGNOSIS — N2581 Secondary hyperparathyroidism of renal origin: Secondary | ICD-10-CM | POA: Diagnosis not present

## 2020-11-20 DIAGNOSIS — N186 End stage renal disease: Secondary | ICD-10-CM | POA: Diagnosis not present

## 2020-11-20 DIAGNOSIS — D631 Anemia in chronic kidney disease: Secondary | ICD-10-CM | POA: Diagnosis not present

## 2020-11-20 DIAGNOSIS — D509 Iron deficiency anemia, unspecified: Secondary | ICD-10-CM | POA: Diagnosis not present

## 2020-11-24 DIAGNOSIS — D509 Iron deficiency anemia, unspecified: Secondary | ICD-10-CM | POA: Diagnosis not present

## 2020-11-24 DIAGNOSIS — N2581 Secondary hyperparathyroidism of renal origin: Secondary | ICD-10-CM | POA: Diagnosis not present

## 2020-11-24 DIAGNOSIS — D631 Anemia in chronic kidney disease: Secondary | ICD-10-CM | POA: Diagnosis not present

## 2020-11-24 DIAGNOSIS — N186 End stage renal disease: Secondary | ICD-10-CM | POA: Diagnosis not present

## 2020-11-27 DIAGNOSIS — N2581 Secondary hyperparathyroidism of renal origin: Secondary | ICD-10-CM | POA: Diagnosis not present

## 2020-11-27 DIAGNOSIS — D509 Iron deficiency anemia, unspecified: Secondary | ICD-10-CM | POA: Diagnosis not present

## 2020-11-27 DIAGNOSIS — N186 End stage renal disease: Secondary | ICD-10-CM | POA: Diagnosis not present

## 2020-11-27 DIAGNOSIS — D631 Anemia in chronic kidney disease: Secondary | ICD-10-CM | POA: Diagnosis not present

## 2020-12-01 DIAGNOSIS — D509 Iron deficiency anemia, unspecified: Secondary | ICD-10-CM | POA: Diagnosis not present

## 2020-12-01 DIAGNOSIS — N2581 Secondary hyperparathyroidism of renal origin: Secondary | ICD-10-CM | POA: Diagnosis not present

## 2020-12-01 DIAGNOSIS — D631 Anemia in chronic kidney disease: Secondary | ICD-10-CM | POA: Diagnosis not present

## 2020-12-01 DIAGNOSIS — N186 End stage renal disease: Secondary | ICD-10-CM | POA: Diagnosis not present

## 2020-12-04 DIAGNOSIS — D631 Anemia in chronic kidney disease: Secondary | ICD-10-CM | POA: Diagnosis not present

## 2020-12-04 DIAGNOSIS — N2581 Secondary hyperparathyroidism of renal origin: Secondary | ICD-10-CM | POA: Diagnosis not present

## 2020-12-04 DIAGNOSIS — D509 Iron deficiency anemia, unspecified: Secondary | ICD-10-CM | POA: Diagnosis not present

## 2020-12-04 DIAGNOSIS — N186 End stage renal disease: Secondary | ICD-10-CM | POA: Diagnosis not present

## 2020-12-06 DIAGNOSIS — N186 End stage renal disease: Secondary | ICD-10-CM | POA: Diagnosis not present

## 2020-12-06 DIAGNOSIS — D509 Iron deficiency anemia, unspecified: Secondary | ICD-10-CM | POA: Diagnosis not present

## 2020-12-06 DIAGNOSIS — D631 Anemia in chronic kidney disease: Secondary | ICD-10-CM | POA: Diagnosis not present

## 2020-12-06 DIAGNOSIS — N2581 Secondary hyperparathyroidism of renal origin: Secondary | ICD-10-CM | POA: Diagnosis not present

## 2020-12-11 DIAGNOSIS — N2581 Secondary hyperparathyroidism of renal origin: Secondary | ICD-10-CM | POA: Diagnosis not present

## 2020-12-11 DIAGNOSIS — D631 Anemia in chronic kidney disease: Secondary | ICD-10-CM | POA: Diagnosis not present

## 2020-12-11 DIAGNOSIS — D509 Iron deficiency anemia, unspecified: Secondary | ICD-10-CM | POA: Diagnosis not present

## 2020-12-11 DIAGNOSIS — N186 End stage renal disease: Secondary | ICD-10-CM | POA: Diagnosis not present

## 2020-12-13 DIAGNOSIS — N186 End stage renal disease: Secondary | ICD-10-CM | POA: Diagnosis not present

## 2020-12-13 DIAGNOSIS — N2581 Secondary hyperparathyroidism of renal origin: Secondary | ICD-10-CM | POA: Diagnosis not present

## 2020-12-13 DIAGNOSIS — D631 Anemia in chronic kidney disease: Secondary | ICD-10-CM | POA: Diagnosis not present

## 2020-12-13 DIAGNOSIS — D509 Iron deficiency anemia, unspecified: Secondary | ICD-10-CM | POA: Diagnosis not present

## 2020-12-15 DIAGNOSIS — D509 Iron deficiency anemia, unspecified: Secondary | ICD-10-CM | POA: Diagnosis not present

## 2020-12-15 DIAGNOSIS — D631 Anemia in chronic kidney disease: Secondary | ICD-10-CM | POA: Diagnosis not present

## 2020-12-15 DIAGNOSIS — N186 End stage renal disease: Secondary | ICD-10-CM | POA: Diagnosis not present

## 2020-12-15 DIAGNOSIS — N2581 Secondary hyperparathyroidism of renal origin: Secondary | ICD-10-CM | POA: Diagnosis not present

## 2020-12-18 DIAGNOSIS — D509 Iron deficiency anemia, unspecified: Secondary | ICD-10-CM | POA: Diagnosis not present

## 2020-12-18 DIAGNOSIS — N186 End stage renal disease: Secondary | ICD-10-CM | POA: Diagnosis not present

## 2020-12-18 DIAGNOSIS — N2581 Secondary hyperparathyroidism of renal origin: Secondary | ICD-10-CM | POA: Diagnosis not present

## 2020-12-18 DIAGNOSIS — D631 Anemia in chronic kidney disease: Secondary | ICD-10-CM | POA: Diagnosis not present

## 2020-12-22 DIAGNOSIS — N2581 Secondary hyperparathyroidism of renal origin: Secondary | ICD-10-CM | POA: Diagnosis not present

## 2020-12-22 DIAGNOSIS — D509 Iron deficiency anemia, unspecified: Secondary | ICD-10-CM | POA: Diagnosis not present

## 2020-12-22 DIAGNOSIS — N186 End stage renal disease: Secondary | ICD-10-CM | POA: Diagnosis not present

## 2020-12-22 DIAGNOSIS — D631 Anemia in chronic kidney disease: Secondary | ICD-10-CM | POA: Diagnosis not present

## 2020-12-25 DIAGNOSIS — N186 End stage renal disease: Secondary | ICD-10-CM | POA: Diagnosis not present

## 2020-12-25 DIAGNOSIS — N2581 Secondary hyperparathyroidism of renal origin: Secondary | ICD-10-CM | POA: Diagnosis not present

## 2020-12-25 DIAGNOSIS — D631 Anemia in chronic kidney disease: Secondary | ICD-10-CM | POA: Diagnosis not present

## 2020-12-25 DIAGNOSIS — D509 Iron deficiency anemia, unspecified: Secondary | ICD-10-CM | POA: Diagnosis not present

## 2020-12-26 DIAGNOSIS — Z992 Dependence on renal dialysis: Secondary | ICD-10-CM | POA: Diagnosis not present

## 2020-12-26 DIAGNOSIS — N186 End stage renal disease: Secondary | ICD-10-CM | POA: Diagnosis not present

## 2020-12-27 DIAGNOSIS — D631 Anemia in chronic kidney disease: Secondary | ICD-10-CM | POA: Diagnosis not present

## 2020-12-27 DIAGNOSIS — N2581 Secondary hyperparathyroidism of renal origin: Secondary | ICD-10-CM | POA: Diagnosis not present

## 2020-12-27 DIAGNOSIS — N186 End stage renal disease: Secondary | ICD-10-CM | POA: Diagnosis not present

## 2020-12-27 DIAGNOSIS — D509 Iron deficiency anemia, unspecified: Secondary | ICD-10-CM | POA: Diagnosis not present

## 2021-01-01 DIAGNOSIS — D631 Anemia in chronic kidney disease: Secondary | ICD-10-CM | POA: Diagnosis not present

## 2021-01-01 DIAGNOSIS — N186 End stage renal disease: Secondary | ICD-10-CM | POA: Diagnosis not present

## 2021-01-01 DIAGNOSIS — D509 Iron deficiency anemia, unspecified: Secondary | ICD-10-CM | POA: Diagnosis not present

## 2021-01-01 DIAGNOSIS — N2581 Secondary hyperparathyroidism of renal origin: Secondary | ICD-10-CM | POA: Diagnosis not present

## 2021-01-03 DIAGNOSIS — D631 Anemia in chronic kidney disease: Secondary | ICD-10-CM | POA: Diagnosis not present

## 2021-01-03 DIAGNOSIS — N186 End stage renal disease: Secondary | ICD-10-CM | POA: Diagnosis not present

## 2021-01-03 DIAGNOSIS — N2581 Secondary hyperparathyroidism of renal origin: Secondary | ICD-10-CM | POA: Diagnosis not present

## 2021-01-03 DIAGNOSIS — D509 Iron deficiency anemia, unspecified: Secondary | ICD-10-CM | POA: Diagnosis not present

## 2021-01-05 DIAGNOSIS — D631 Anemia in chronic kidney disease: Secondary | ICD-10-CM | POA: Diagnosis not present

## 2021-01-05 DIAGNOSIS — N2581 Secondary hyperparathyroidism of renal origin: Secondary | ICD-10-CM | POA: Diagnosis not present

## 2021-01-05 DIAGNOSIS — N186 End stage renal disease: Secondary | ICD-10-CM | POA: Diagnosis not present

## 2021-01-05 DIAGNOSIS — D509 Iron deficiency anemia, unspecified: Secondary | ICD-10-CM | POA: Diagnosis not present

## 2021-01-09 ENCOUNTER — Other Ambulatory Visit: Payer: Self-pay | Admitting: Family Medicine

## 2021-01-09 DIAGNOSIS — F418 Other specified anxiety disorders: Secondary | ICD-10-CM

## 2021-01-09 DIAGNOSIS — M329 Systemic lupus erythematosus, unspecified: Secondary | ICD-10-CM

## 2021-01-10 DIAGNOSIS — D509 Iron deficiency anemia, unspecified: Secondary | ICD-10-CM | POA: Diagnosis not present

## 2021-01-10 DIAGNOSIS — N2581 Secondary hyperparathyroidism of renal origin: Secondary | ICD-10-CM | POA: Diagnosis not present

## 2021-01-10 DIAGNOSIS — N186 End stage renal disease: Secondary | ICD-10-CM | POA: Diagnosis not present

## 2021-01-10 DIAGNOSIS — D631 Anemia in chronic kidney disease: Secondary | ICD-10-CM | POA: Diagnosis not present

## 2021-01-12 DIAGNOSIS — D631 Anemia in chronic kidney disease: Secondary | ICD-10-CM | POA: Diagnosis not present

## 2021-01-12 DIAGNOSIS — N186 End stage renal disease: Secondary | ICD-10-CM | POA: Diagnosis not present

## 2021-01-12 DIAGNOSIS — D509 Iron deficiency anemia, unspecified: Secondary | ICD-10-CM | POA: Diagnosis not present

## 2021-01-12 DIAGNOSIS — N2581 Secondary hyperparathyroidism of renal origin: Secondary | ICD-10-CM | POA: Diagnosis not present

## 2021-01-15 DIAGNOSIS — N186 End stage renal disease: Secondary | ICD-10-CM | POA: Diagnosis not present

## 2021-01-15 DIAGNOSIS — D631 Anemia in chronic kidney disease: Secondary | ICD-10-CM | POA: Diagnosis not present

## 2021-01-15 DIAGNOSIS — D509 Iron deficiency anemia, unspecified: Secondary | ICD-10-CM | POA: Diagnosis not present

## 2021-01-15 DIAGNOSIS — N2581 Secondary hyperparathyroidism of renal origin: Secondary | ICD-10-CM | POA: Diagnosis not present

## 2021-01-17 DIAGNOSIS — N2581 Secondary hyperparathyroidism of renal origin: Secondary | ICD-10-CM | POA: Diagnosis not present

## 2021-01-17 DIAGNOSIS — D509 Iron deficiency anemia, unspecified: Secondary | ICD-10-CM | POA: Diagnosis not present

## 2021-01-17 DIAGNOSIS — N186 End stage renal disease: Secondary | ICD-10-CM | POA: Diagnosis not present

## 2021-01-17 DIAGNOSIS — D631 Anemia in chronic kidney disease: Secondary | ICD-10-CM | POA: Diagnosis not present

## 2021-01-22 DIAGNOSIS — D509 Iron deficiency anemia, unspecified: Secondary | ICD-10-CM | POA: Diagnosis not present

## 2021-01-22 DIAGNOSIS — N186 End stage renal disease: Secondary | ICD-10-CM | POA: Diagnosis not present

## 2021-01-22 DIAGNOSIS — D631 Anemia in chronic kidney disease: Secondary | ICD-10-CM | POA: Diagnosis not present

## 2021-01-22 DIAGNOSIS — N2581 Secondary hyperparathyroidism of renal origin: Secondary | ICD-10-CM | POA: Diagnosis not present

## 2021-01-24 DIAGNOSIS — D631 Anemia in chronic kidney disease: Secondary | ICD-10-CM | POA: Diagnosis not present

## 2021-01-24 DIAGNOSIS — N2581 Secondary hyperparathyroidism of renal origin: Secondary | ICD-10-CM | POA: Diagnosis not present

## 2021-01-24 DIAGNOSIS — N186 End stage renal disease: Secondary | ICD-10-CM | POA: Diagnosis not present

## 2021-01-24 DIAGNOSIS — D509 Iron deficiency anemia, unspecified: Secondary | ICD-10-CM | POA: Diagnosis not present

## 2021-01-25 DIAGNOSIS — Z992 Dependence on renal dialysis: Secondary | ICD-10-CM | POA: Diagnosis not present

## 2021-01-25 DIAGNOSIS — N186 End stage renal disease: Secondary | ICD-10-CM | POA: Diagnosis not present

## 2021-01-26 DIAGNOSIS — D631 Anemia in chronic kidney disease: Secondary | ICD-10-CM | POA: Diagnosis not present

## 2021-01-26 DIAGNOSIS — N186 End stage renal disease: Secondary | ICD-10-CM | POA: Diagnosis not present

## 2021-01-26 DIAGNOSIS — N2581 Secondary hyperparathyroidism of renal origin: Secondary | ICD-10-CM | POA: Diagnosis not present

## 2021-01-26 DIAGNOSIS — D509 Iron deficiency anemia, unspecified: Secondary | ICD-10-CM | POA: Diagnosis not present

## 2021-01-29 DIAGNOSIS — N186 End stage renal disease: Secondary | ICD-10-CM | POA: Diagnosis not present

## 2021-01-29 DIAGNOSIS — D509 Iron deficiency anemia, unspecified: Secondary | ICD-10-CM | POA: Diagnosis not present

## 2021-01-29 DIAGNOSIS — N2581 Secondary hyperparathyroidism of renal origin: Secondary | ICD-10-CM | POA: Diagnosis not present

## 2021-01-29 DIAGNOSIS — D631 Anemia in chronic kidney disease: Secondary | ICD-10-CM | POA: Diagnosis not present

## 2021-01-30 ENCOUNTER — Other Ambulatory Visit: Payer: Self-pay | Admitting: Family Medicine

## 2021-01-30 DIAGNOSIS — M329 Systemic lupus erythematosus, unspecified: Secondary | ICD-10-CM

## 2021-02-02 DIAGNOSIS — D631 Anemia in chronic kidney disease: Secondary | ICD-10-CM | POA: Diagnosis not present

## 2021-02-02 DIAGNOSIS — N186 End stage renal disease: Secondary | ICD-10-CM | POA: Diagnosis not present

## 2021-02-02 DIAGNOSIS — N2581 Secondary hyperparathyroidism of renal origin: Secondary | ICD-10-CM | POA: Diagnosis not present

## 2021-02-02 DIAGNOSIS — D509 Iron deficiency anemia, unspecified: Secondary | ICD-10-CM | POA: Diagnosis not present

## 2021-02-05 DIAGNOSIS — D631 Anemia in chronic kidney disease: Secondary | ICD-10-CM | POA: Diagnosis not present

## 2021-02-05 DIAGNOSIS — N186 End stage renal disease: Secondary | ICD-10-CM | POA: Diagnosis not present

## 2021-02-05 DIAGNOSIS — D509 Iron deficiency anemia, unspecified: Secondary | ICD-10-CM | POA: Diagnosis not present

## 2021-02-05 DIAGNOSIS — N2581 Secondary hyperparathyroidism of renal origin: Secondary | ICD-10-CM | POA: Diagnosis not present

## 2021-02-07 DIAGNOSIS — N186 End stage renal disease: Secondary | ICD-10-CM | POA: Diagnosis not present

## 2021-02-07 DIAGNOSIS — D631 Anemia in chronic kidney disease: Secondary | ICD-10-CM | POA: Diagnosis not present

## 2021-02-07 DIAGNOSIS — N2581 Secondary hyperparathyroidism of renal origin: Secondary | ICD-10-CM | POA: Diagnosis not present

## 2021-02-07 DIAGNOSIS — D509 Iron deficiency anemia, unspecified: Secondary | ICD-10-CM | POA: Diagnosis not present

## 2021-02-09 DIAGNOSIS — N186 End stage renal disease: Secondary | ICD-10-CM | POA: Diagnosis not present

## 2021-02-09 DIAGNOSIS — D631 Anemia in chronic kidney disease: Secondary | ICD-10-CM | POA: Diagnosis not present

## 2021-02-09 DIAGNOSIS — N2581 Secondary hyperparathyroidism of renal origin: Secondary | ICD-10-CM | POA: Diagnosis not present

## 2021-02-09 DIAGNOSIS — D509 Iron deficiency anemia, unspecified: Secondary | ICD-10-CM | POA: Diagnosis not present

## 2021-02-12 DIAGNOSIS — N2581 Secondary hyperparathyroidism of renal origin: Secondary | ICD-10-CM | POA: Diagnosis not present

## 2021-02-12 DIAGNOSIS — N186 End stage renal disease: Secondary | ICD-10-CM | POA: Diagnosis not present

## 2021-02-12 DIAGNOSIS — D509 Iron deficiency anemia, unspecified: Secondary | ICD-10-CM | POA: Diagnosis not present

## 2021-02-12 DIAGNOSIS — D631 Anemia in chronic kidney disease: Secondary | ICD-10-CM | POA: Diagnosis not present

## 2021-02-14 DIAGNOSIS — D631 Anemia in chronic kidney disease: Secondary | ICD-10-CM | POA: Diagnosis not present

## 2021-02-14 DIAGNOSIS — N2581 Secondary hyperparathyroidism of renal origin: Secondary | ICD-10-CM | POA: Diagnosis not present

## 2021-02-14 DIAGNOSIS — N186 End stage renal disease: Secondary | ICD-10-CM | POA: Diagnosis not present

## 2021-02-14 DIAGNOSIS — D509 Iron deficiency anemia, unspecified: Secondary | ICD-10-CM | POA: Diagnosis not present

## 2021-02-16 DIAGNOSIS — D631 Anemia in chronic kidney disease: Secondary | ICD-10-CM | POA: Diagnosis not present

## 2021-02-16 DIAGNOSIS — N2581 Secondary hyperparathyroidism of renal origin: Secondary | ICD-10-CM | POA: Diagnosis not present

## 2021-02-16 DIAGNOSIS — N186 End stage renal disease: Secondary | ICD-10-CM | POA: Diagnosis not present

## 2021-02-16 DIAGNOSIS — D509 Iron deficiency anemia, unspecified: Secondary | ICD-10-CM | POA: Diagnosis not present

## 2021-02-19 DIAGNOSIS — D631 Anemia in chronic kidney disease: Secondary | ICD-10-CM | POA: Diagnosis not present

## 2021-02-19 DIAGNOSIS — N2581 Secondary hyperparathyroidism of renal origin: Secondary | ICD-10-CM | POA: Diagnosis not present

## 2021-02-19 DIAGNOSIS — D509 Iron deficiency anemia, unspecified: Secondary | ICD-10-CM | POA: Diagnosis not present

## 2021-02-19 DIAGNOSIS — N186 End stage renal disease: Secondary | ICD-10-CM | POA: Diagnosis not present

## 2021-02-23 DIAGNOSIS — N2581 Secondary hyperparathyroidism of renal origin: Secondary | ICD-10-CM | POA: Diagnosis not present

## 2021-02-23 DIAGNOSIS — N186 End stage renal disease: Secondary | ICD-10-CM | POA: Diagnosis not present

## 2021-02-23 DIAGNOSIS — D631 Anemia in chronic kidney disease: Secondary | ICD-10-CM | POA: Diagnosis not present

## 2021-02-23 DIAGNOSIS — D509 Iron deficiency anemia, unspecified: Secondary | ICD-10-CM | POA: Diagnosis not present

## 2021-02-25 DIAGNOSIS — N186 End stage renal disease: Secondary | ICD-10-CM | POA: Diagnosis not present

## 2021-02-25 DIAGNOSIS — Z992 Dependence on renal dialysis: Secondary | ICD-10-CM | POA: Diagnosis not present

## 2021-02-26 ENCOUNTER — Ambulatory Visit: Payer: Medicare Other | Admitting: Family Medicine

## 2021-02-26 DIAGNOSIS — N186 End stage renal disease: Secondary | ICD-10-CM | POA: Diagnosis not present

## 2021-02-26 DIAGNOSIS — D509 Iron deficiency anemia, unspecified: Secondary | ICD-10-CM | POA: Diagnosis not present

## 2021-02-26 DIAGNOSIS — D631 Anemia in chronic kidney disease: Secondary | ICD-10-CM | POA: Diagnosis not present

## 2021-02-26 DIAGNOSIS — N2581 Secondary hyperparathyroidism of renal origin: Secondary | ICD-10-CM | POA: Diagnosis not present

## 2021-02-28 DIAGNOSIS — D509 Iron deficiency anemia, unspecified: Secondary | ICD-10-CM | POA: Diagnosis not present

## 2021-02-28 DIAGNOSIS — D631 Anemia in chronic kidney disease: Secondary | ICD-10-CM | POA: Diagnosis not present

## 2021-02-28 DIAGNOSIS — N2581 Secondary hyperparathyroidism of renal origin: Secondary | ICD-10-CM | POA: Diagnosis not present

## 2021-02-28 DIAGNOSIS — N186 End stage renal disease: Secondary | ICD-10-CM | POA: Diagnosis not present

## 2021-03-05 DIAGNOSIS — D631 Anemia in chronic kidney disease: Secondary | ICD-10-CM | POA: Diagnosis not present

## 2021-03-05 DIAGNOSIS — D509 Iron deficiency anemia, unspecified: Secondary | ICD-10-CM | POA: Diagnosis not present

## 2021-03-05 DIAGNOSIS — N186 End stage renal disease: Secondary | ICD-10-CM | POA: Diagnosis not present

## 2021-03-05 DIAGNOSIS — N2581 Secondary hyperparathyroidism of renal origin: Secondary | ICD-10-CM | POA: Diagnosis not present

## 2021-03-06 ENCOUNTER — Telehealth: Payer: Self-pay | Admitting: *Deleted

## 2021-03-06 ENCOUNTER — Encounter: Payer: Self-pay | Admitting: Family Medicine

## 2021-03-06 ENCOUNTER — Ambulatory Visit (INDEPENDENT_AMBULATORY_CARE_PROVIDER_SITE_OTHER): Payer: Medicare HMO | Admitting: Family Medicine

## 2021-03-06 ENCOUNTER — Other Ambulatory Visit: Payer: Self-pay

## 2021-03-06 VITALS — BP 152/72 | HR 63 | Temp 98.1°F | Ht 66.0 in | Wt 147.0 lb

## 2021-03-06 DIAGNOSIS — N2581 Secondary hyperparathyroidism of renal origin: Secondary | ICD-10-CM | POA: Diagnosis not present

## 2021-03-06 DIAGNOSIS — M329 Systemic lupus erythematosus, unspecified: Secondary | ICD-10-CM | POA: Diagnosis not present

## 2021-03-06 DIAGNOSIS — E785 Hyperlipidemia, unspecified: Secondary | ICD-10-CM

## 2021-03-06 DIAGNOSIS — I10 Essential (primary) hypertension: Secondary | ICD-10-CM | POA: Diagnosis not present

## 2021-03-06 DIAGNOSIS — F331 Major depressive disorder, recurrent, moderate: Secondary | ICD-10-CM | POA: Diagnosis not present

## 2021-03-06 DIAGNOSIS — I671 Cerebral aneurysm, nonruptured: Secondary | ICD-10-CM | POA: Diagnosis not present

## 2021-03-06 DIAGNOSIS — Z1159 Encounter for screening for other viral diseases: Secondary | ICD-10-CM

## 2021-03-06 DIAGNOSIS — E2839 Other primary ovarian failure: Secondary | ICD-10-CM

## 2021-03-06 DIAGNOSIS — Z23 Encounter for immunization: Secondary | ICD-10-CM | POA: Diagnosis not present

## 2021-03-06 DIAGNOSIS — Z1231 Encounter for screening mammogram for malignant neoplasm of breast: Secondary | ICD-10-CM | POA: Diagnosis not present

## 2021-03-06 DIAGNOSIS — Z0000 Encounter for general adult medical examination without abnormal findings: Secondary | ICD-10-CM | POA: Diagnosis not present

## 2021-03-06 DIAGNOSIS — Z1211 Encounter for screening for malignant neoplasm of colon: Secondary | ICD-10-CM | POA: Diagnosis not present

## 2021-03-06 DIAGNOSIS — N186 End stage renal disease: Secondary | ICD-10-CM

## 2021-03-06 DIAGNOSIS — H539 Unspecified visual disturbance: Secondary | ICD-10-CM

## 2021-03-06 LAB — LIPID PANEL
Cholesterol: 216 mg/dL — ABNORMAL HIGH (ref 0–200)
HDL: 53.4 mg/dL (ref >39.00)
LDL Cholesterol: 152 mg/dL — ABNORMAL HIGH (ref 0–99)
NonHDL: 162.19
Total CHOL/HDL Ratio: 4
Triglycerides: 51 mg/dL (ref 0.0–149.0)
VLDL: 10.2 mg/dL (ref 0.0–40.0)

## 2021-03-06 LAB — COMPREHENSIVE METABOLIC PANEL
ALT: 14 U/L (ref 0–35)
AST: 16 U/L (ref 0–37)
Albumin: 3.9 g/dL (ref 3.5–5.2)
Alkaline Phosphatase: 65 U/L (ref 39–117)
BUN: 29 mg/dL — ABNORMAL HIGH (ref 6–23)
CO2: 29 mEq/L (ref 19–32)
Calcium: 9.5 mg/dL (ref 8.4–10.5)
Chloride: 101 mEq/L (ref 96–112)
Creatinine, Ser: 6.02 mg/dL (ref 0.40–1.20)
GFR: 6.84 mL/min — CL (ref >60.00)
Glucose, Bld: 81 mg/dL (ref 70–99)
Potassium: 4.3 mEq/L (ref 3.5–5.1)
Sodium: 140 mEq/L (ref 135–145)
Total Bilirubin: 0.6 mg/dL (ref 0.2–1.2)
Total Protein: 7.1 g/dL (ref 6.0–8.3)

## 2021-03-06 MED ORDER — CLONIDINE HCL 0.1 MG PO TABS: 0.1000 mg | ORAL_TABLET | Freq: Two times a day (BID) | ORAL | 2 refills | Status: DC

## 2021-03-06 NOTE — Progress Notes (Signed)
Chief Complaint  Patient presents with   Follow-up    Referrals today Having problems with being forgetful     Well Woman Karen Carr is here for a complete physical.   Her last physical was >1 year ago.  Current diet: in general, a "healthy" diet. Current exercise: walking. Weight is decreased and she denies daytime fatigue. Seatbelt? Yes  Health Maintenance Colonoscopy- No Shingrix- No Lung cancer screening- N/A, 5-7 pack year hx DEXA- No Mammogram- Due Tetanus- Yes Pneumonia- No Hep C screen- No  Hypertension Patient presents for hypertension follow up. She does monitor home blood pressures. Blood pressures ranging on average from 120-160's/80's. She is compliant with medications-metoprolol 150 mg every afternoon, hydralazine 50 mg 3 times daily, clonidine 0.1 mg twice daily, amlodipine 10 mg daily, lisinopril 20 mg daily. Patient has these side effects of medication: none Diet and exercise as above.  Past Medical History:  Diagnosis Date   ESRD (end stage renal disease) ([place])    Gout    Hyperlipidemia    Hypertension    Major depressive disorder    SLE (systemic lupus erythematosus) ([place])    Vitamin D deficiency      Past Surgical History:  Procedure Laterality Date   CESAREAN SECTION  1987   TUBAL LIGATION      Medications  Current Outpatient Medications on File Prior to Visit  Medication Sig Dispense Refill   amLODipine (NORVASC) 10 MG tablet Take 1 tablet (10 mg total) by mouth daily. 90 tablet 1   hydrALAZINE (APRESOLINE) 50 MG tablet Take 1 tablet (50 mg total) by mouth 3 (three) times daily. 90 tablet 3   hydroxychloroquine (PLAQUENIL) 200 MG tablet Take 1 tablet by mouth twice daily 60 tablet 0   lisinopril (ZESTRIL) 20 MG tablet Take 1 tab daily at 3 PM 90 tablet 3   metoprolol succinate (TOPROL-XL) 100 MG 24 hr tablet Take 1 tab daily at 3 PM with a 50 mg tab for a total of 150 mg daily. 60 tablet 3   metoprolol tartrate (LOPRESSOR) 50 MG tablet  Take 1 tab daily at 3 PM with a 100 mg tab for a total of 150 mg daily. 180 tablet 3   sertraline (ZOLOFT) 50 MG tablet Take 1 tablet by mouth once daily 90 tablet 0   Allergies No Known Allergies  Review of Systems: Constitutional:  no fevers Eye:  + Worsening vision Ears:  No changes in hearing Nose/Mouth/Throat:  no complaints of nasal congestion, no sore throat Cardiovascular: no chest pain Respiratory:  No shortness of breath Gastrointestinal:  No change in bowel habits GU:  Female: negative for dysuria Integumentary:  no abnormal skin lesions reported Neurologic:  no headaches Endocrine:  denies unexplained weight gain  Exam BP (!) 152/72 (BP Location: Right Arm, Cuff Size: Normal)   Pulse 63   Temp 98.1 F (36.7 C) (Oral)   Ht 5\' 6"  (1.676 m)   Wt 147 lb (66.7 kg)   SpO2 98%   BMI 23.73 kg/m  General:  well developed, well nourished, in no apparent distress Skin:  no significant moles, warts, or growths Head:  no masses, lesions, or tenderness Eyes:  pupils equal and round, sclera anicteric without injection Ears:  canals without lesions, TMs shiny without retraction, no obvious effusion, no erythema Nose:  nares patent, septum midline, mucosa normal, and no drainage or sinus tenderness Throat/Pharynx:  lips and gingiva without lesion; tongue and uvula midline; non-inflamed pharynx; no exudates or postnasal drainage  Neck: neck supple without adenopathy, thyromegaly, or masses Lungs:  clear to auscultation, breath sounds equal bilaterally, no respiratory distress Cardio:  regular rate and rhythm, no bruits or LE edema Abdomen:  abdomen soft, nontender; bowel sounds normal; no masses or organomegaly Genital: Deferred Neuro:  gait normal; deep tendon reflexes normal and symmetric Psych: well oriented with normal range of affect and appropriate judgment/insight  Assessment and Plan  Well adult exam  Essential hypertension  Hyperlipidemia, unspecified  hyperlipidemia type - Plan: Comprehensive metabolic panel, Lipid panel  Systemic lupus erythematosus, unspecified SLE type, unspecified organ involvement status ([place]), Chronic  MDD (major depressive disorder), recurrent episode, moderate ([place]), Chronic  ESRD (end stage renal disease) ([place]), Chronic  Hyperparathyroidism due to renal insufficiency ([place]), Chronic  Middle cerebral artery aneurysm - Plan: Ambulatory referral to Neurosurgery  Encounter for screening mammogram for malignant neoplasm of breast - Plan: MM DIGITAL SCREENING BILATERAL  Screen for colon cancer - Plan: Ambulatory referral to Gastroenterology  Vision changes - Plan: Ambulatory referral to Ophthalmology  Estrogen deficiency - Plan: DG Bone Density  Encounter for hepatitis C screening test for low risk patient - Plan: Hepatitis C antibody  Need for vaccination against Streptococcus pneumoniae - Plan: Pneumococcal conjugate vaccine 20-valent (Prevnar 18)   Well 65 y.o. female. Counseled on diet and exercise. Other orders as above. Update mammogram, pap and DEXA. GYN info provided.  Shingrix recommended, will need to contact pharmacy.  Flu shot recommended in October. PCV 20 today. Refer to GI for colon cancer screening. Hypertension: Chronic, uncontrolled.  She will monitor her blood pressure at home and come in for nurse visit in 2 weeks.  Continue clonidine 0.1 mg twice daily, lisinopril 20 mg daily, hydralazine 50 mg 3 times a day, amlodipine 10 mg daily, metoprolol. Follow up in 2 weeks for nurse BP check. The patient voiced understanding and agreement to the plan.  [place], DO 03/06/21 11:03 AM

## 2021-03-06 NOTE — Patient Instructions (Addendum)
Give [place] 2-3 business days to get the results of your labs back.   Keep the diet clean and stay active.  Call Center for [place] at [facility] at [phone] for an appointment.  They are located at [address], [place], [place], [address] (right across the hall from our office).  If you do not hear anything about your referrals in the next 1-2 weeks, call our office and ask for an update.  The new Shingrix vaccine (for shingles) is a 2 shot series. It can make people feel low energy, achy and almost like they have the flu for 48 hours after injection. Please plan accordingly when deciding on when to get this shot. Call our office for a nurse visit appointment to get this. The second shot of the series is less severe regarding the side effects, but it still lasts 48 hours.   I recommend getting the flu shot in mid October. This suggestion would change if the CDC comes out with a different recommendation.   Let us know if you need anything.

## 2021-03-06 NOTE — Telephone Encounter (Signed)
CRITICAL VALUE STICKER  CRITICAL VALUE: creatinine 6.02  and GFR 6.84   RECEIVER (on-site recipient of call): [place] NOTIFIED: 456pm  MESSENGER (representative from lab):Esperanza Richters   MD NOTIFIED: Nani Ravens   TIME OF NOTIFICATION: 458pm  RESPONSE:

## 2021-03-07 ENCOUNTER — Other Ambulatory Visit: Payer: Self-pay | Admitting: Family Medicine

## 2021-03-07 DIAGNOSIS — N2581 Secondary hyperparathyroidism of renal origin: Secondary | ICD-10-CM | POA: Diagnosis not present

## 2021-03-07 DIAGNOSIS — E785 Hyperlipidemia, unspecified: Secondary | ICD-10-CM

## 2021-03-07 DIAGNOSIS — N186 End stage renal disease: Secondary | ICD-10-CM | POA: Diagnosis not present

## 2021-03-07 DIAGNOSIS — D631 Anemia in chronic kidney disease: Secondary | ICD-10-CM | POA: Diagnosis not present

## 2021-03-07 DIAGNOSIS — D509 Iron deficiency anemia, unspecified: Secondary | ICD-10-CM | POA: Diagnosis not present

## 2021-03-07 NOTE — Progress Notes (Signed)
Future labs ordered.  

## 2021-03-07 NOTE — Telephone Encounter (Signed)
Noted. Pt follows w nephro.

## 2021-03-09 DIAGNOSIS — D631 Anemia in chronic kidney disease: Secondary | ICD-10-CM | POA: Diagnosis not present

## 2021-03-09 DIAGNOSIS — N186 End stage renal disease: Secondary | ICD-10-CM | POA: Diagnosis not present

## 2021-03-09 DIAGNOSIS — D509 Iron deficiency anemia, unspecified: Secondary | ICD-10-CM | POA: Diagnosis not present

## 2021-03-09 DIAGNOSIS — N2581 Secondary hyperparathyroidism of renal origin: Secondary | ICD-10-CM | POA: Diagnosis not present

## 2021-03-12 DIAGNOSIS — D509 Iron deficiency anemia, unspecified: Secondary | ICD-10-CM | POA: Diagnosis not present

## 2021-03-12 DIAGNOSIS — N186 End stage renal disease: Secondary | ICD-10-CM | POA: Diagnosis not present

## 2021-03-12 DIAGNOSIS — N2581 Secondary hyperparathyroidism of renal origin: Secondary | ICD-10-CM | POA: Diagnosis not present

## 2021-03-12 DIAGNOSIS — D631 Anemia in chronic kidney disease: Secondary | ICD-10-CM | POA: Diagnosis not present

## 2021-03-14 DIAGNOSIS — N186 End stage renal disease: Secondary | ICD-10-CM | POA: Diagnosis not present

## 2021-03-14 DIAGNOSIS — D631 Anemia in chronic kidney disease: Secondary | ICD-10-CM | POA: Diagnosis not present

## 2021-03-14 DIAGNOSIS — N2581 Secondary hyperparathyroidism of renal origin: Secondary | ICD-10-CM | POA: Diagnosis not present

## 2021-03-14 DIAGNOSIS — D509 Iron deficiency anemia, unspecified: Secondary | ICD-10-CM | POA: Diagnosis not present

## 2021-03-14 LAB — HEPATITIS C ANTIBODY
Hepatitis C Ab: REACTIVE — AB
SIGNAL TO CUT-OFF: 1.58 — ABNORMAL HIGH (ref <1.00)

## 2021-03-14 LAB — HCV RNA,QUANTITATIVE REAL TIME PCR
HCV Quantitative Log: <1.18 Log IU/mL
HCV RNA, PCR, QN: <15 IU/mL

## 2021-03-16 DIAGNOSIS — N2581 Secondary hyperparathyroidism of renal origin: Secondary | ICD-10-CM | POA: Diagnosis not present

## 2021-03-16 DIAGNOSIS — D631 Anemia in chronic kidney disease: Secondary | ICD-10-CM | POA: Diagnosis not present

## 2021-03-16 DIAGNOSIS — N186 End stage renal disease: Secondary | ICD-10-CM | POA: Diagnosis not present

## 2021-03-16 DIAGNOSIS — D509 Iron deficiency anemia, unspecified: Secondary | ICD-10-CM | POA: Diagnosis not present

## 2021-03-19 DIAGNOSIS — D631 Anemia in chronic kidney disease: Secondary | ICD-10-CM | POA: Diagnosis not present

## 2021-03-19 DIAGNOSIS — D509 Iron deficiency anemia, unspecified: Secondary | ICD-10-CM | POA: Diagnosis not present

## 2021-03-19 DIAGNOSIS — N186 End stage renal disease: Secondary | ICD-10-CM | POA: Diagnosis not present

## 2021-03-19 DIAGNOSIS — N2581 Secondary hyperparathyroidism of renal origin: Secondary | ICD-10-CM | POA: Diagnosis not present

## 2021-03-20 DIAGNOSIS — I151 Hypertension secondary to other renal disorders: Secondary | ICD-10-CM | POA: Insufficient documentation

## 2021-03-22 ENCOUNTER — Other Ambulatory Visit: Payer: Self-pay | Admitting: Family Medicine

## 2021-03-22 ENCOUNTER — Ambulatory Visit: Payer: Medicare HMO

## 2021-03-22 DIAGNOSIS — F418 Other specified anxiety disorders: Secondary | ICD-10-CM

## 2021-03-23 DIAGNOSIS — D631 Anemia in chronic kidney disease: Secondary | ICD-10-CM | POA: Diagnosis not present

## 2021-03-23 DIAGNOSIS — N2581 Secondary hyperparathyroidism of renal origin: Secondary | ICD-10-CM | POA: Diagnosis not present

## 2021-03-23 DIAGNOSIS — D509 Iron deficiency anemia, unspecified: Secondary | ICD-10-CM | POA: Diagnosis not present

## 2021-03-23 DIAGNOSIS — N186 End stage renal disease: Secondary | ICD-10-CM | POA: Diagnosis not present

## 2021-03-26 DIAGNOSIS — N186 End stage renal disease: Secondary | ICD-10-CM | POA: Diagnosis not present

## 2021-03-26 DIAGNOSIS — D509 Iron deficiency anemia, unspecified: Secondary | ICD-10-CM | POA: Diagnosis not present

## 2021-03-26 DIAGNOSIS — D631 Anemia in chronic kidney disease: Secondary | ICD-10-CM | POA: Diagnosis not present

## 2021-03-26 DIAGNOSIS — N2581 Secondary hyperparathyroidism of renal origin: Secondary | ICD-10-CM | POA: Diagnosis not present

## 2021-03-27 ENCOUNTER — Ambulatory Visit: Payer: Medicare HMO

## 2021-03-28 DIAGNOSIS — Z992 Dependence on renal dialysis: Secondary | ICD-10-CM | POA: Diagnosis not present

## 2021-03-28 DIAGNOSIS — N186 End stage renal disease: Secondary | ICD-10-CM | POA: Diagnosis not present

## 2021-03-30 DIAGNOSIS — N2581 Secondary hyperparathyroidism of renal origin: Secondary | ICD-10-CM | POA: Diagnosis not present

## 2021-03-30 DIAGNOSIS — N186 End stage renal disease: Secondary | ICD-10-CM | POA: Diagnosis not present

## 2021-03-30 DIAGNOSIS — D631 Anemia in chronic kidney disease: Secondary | ICD-10-CM | POA: Diagnosis not present

## 2021-03-30 DIAGNOSIS — D509 Iron deficiency anemia, unspecified: Secondary | ICD-10-CM | POA: Diagnosis not present

## 2021-04-03 ENCOUNTER — Other Ambulatory Visit: Payer: Self-pay | Admitting: Family Medicine

## 2021-04-03 DIAGNOSIS — M329 Systemic lupus erythematosus, unspecified: Secondary | ICD-10-CM

## 2021-04-04 DIAGNOSIS — N2581 Secondary hyperparathyroidism of renal origin: Secondary | ICD-10-CM | POA: Diagnosis not present

## 2021-04-04 DIAGNOSIS — N186 End stage renal disease: Secondary | ICD-10-CM | POA: Diagnosis not present

## 2021-04-04 DIAGNOSIS — D631 Anemia in chronic kidney disease: Secondary | ICD-10-CM | POA: Diagnosis not present

## 2021-04-04 DIAGNOSIS — D509 Iron deficiency anemia, unspecified: Secondary | ICD-10-CM | POA: Diagnosis not present

## 2021-04-06 DIAGNOSIS — N186 End stage renal disease: Secondary | ICD-10-CM | POA: Diagnosis not present

## 2021-04-06 DIAGNOSIS — N2581 Secondary hyperparathyroidism of renal origin: Secondary | ICD-10-CM | POA: Diagnosis not present

## 2021-04-06 DIAGNOSIS — D509 Iron deficiency anemia, unspecified: Secondary | ICD-10-CM | POA: Diagnosis not present

## 2021-04-06 DIAGNOSIS — D631 Anemia in chronic kidney disease: Secondary | ICD-10-CM | POA: Diagnosis not present

## 2021-04-09 DIAGNOSIS — D509 Iron deficiency anemia, unspecified: Secondary | ICD-10-CM | POA: Diagnosis not present

## 2021-04-09 DIAGNOSIS — N2581 Secondary hyperparathyroidism of renal origin: Secondary | ICD-10-CM | POA: Diagnosis not present

## 2021-04-09 DIAGNOSIS — D631 Anemia in chronic kidney disease: Secondary | ICD-10-CM | POA: Diagnosis not present

## 2021-04-09 DIAGNOSIS — N186 End stage renal disease: Secondary | ICD-10-CM | POA: Diagnosis not present

## 2021-04-11 DIAGNOSIS — D509 Iron deficiency anemia, unspecified: Secondary | ICD-10-CM | POA: Diagnosis not present

## 2021-04-11 DIAGNOSIS — N186 End stage renal disease: Secondary | ICD-10-CM | POA: Diagnosis not present

## 2021-04-11 DIAGNOSIS — D631 Anemia in chronic kidney disease: Secondary | ICD-10-CM | POA: Diagnosis not present

## 2021-04-11 DIAGNOSIS — N2581 Secondary hyperparathyroidism of renal origin: Secondary | ICD-10-CM | POA: Diagnosis not present

## 2021-04-18 DIAGNOSIS — D631 Anemia in chronic kidney disease: Secondary | ICD-10-CM | POA: Diagnosis not present

## 2021-04-18 DIAGNOSIS — N2581 Secondary hyperparathyroidism of renal origin: Secondary | ICD-10-CM | POA: Diagnosis not present

## 2021-04-18 DIAGNOSIS — N186 End stage renal disease: Secondary | ICD-10-CM | POA: Diagnosis not present

## 2021-04-18 DIAGNOSIS — D509 Iron deficiency anemia, unspecified: Secondary | ICD-10-CM | POA: Diagnosis not present

## 2021-04-19 ENCOUNTER — Other Ambulatory Visit: Payer: Medicare HMO

## 2021-04-20 DIAGNOSIS — N186 End stage renal disease: Secondary | ICD-10-CM | POA: Diagnosis not present

## 2021-04-20 DIAGNOSIS — N2581 Secondary hyperparathyroidism of renal origin: Secondary | ICD-10-CM | POA: Diagnosis not present

## 2021-04-20 DIAGNOSIS — D509 Iron deficiency anemia, unspecified: Secondary | ICD-10-CM | POA: Diagnosis not present

## 2021-04-20 DIAGNOSIS — D631 Anemia in chronic kidney disease: Secondary | ICD-10-CM | POA: Diagnosis not present

## 2021-04-25 DIAGNOSIS — N186 End stage renal disease: Secondary | ICD-10-CM | POA: Diagnosis not present

## 2021-04-25 DIAGNOSIS — D509 Iron deficiency anemia, unspecified: Secondary | ICD-10-CM | POA: Diagnosis not present

## 2021-04-25 DIAGNOSIS — N2581 Secondary hyperparathyroidism of renal origin: Secondary | ICD-10-CM | POA: Diagnosis not present

## 2021-04-25 DIAGNOSIS — D631 Anemia in chronic kidney disease: Secondary | ICD-10-CM | POA: Diagnosis not present

## 2021-04-27 DIAGNOSIS — Z992 Dependence on renal dialysis: Secondary | ICD-10-CM | POA: Diagnosis not present

## 2021-04-27 DIAGNOSIS — N186 End stage renal disease: Secondary | ICD-10-CM | POA: Diagnosis not present

## 2021-05-02 DIAGNOSIS — D631 Anemia in chronic kidney disease: Secondary | ICD-10-CM | POA: Diagnosis not present

## 2021-05-02 DIAGNOSIS — D509 Iron deficiency anemia, unspecified: Secondary | ICD-10-CM | POA: Diagnosis not present

## 2021-05-02 DIAGNOSIS — N2581 Secondary hyperparathyroidism of renal origin: Secondary | ICD-10-CM | POA: Diagnosis not present

## 2021-05-02 DIAGNOSIS — N186 End stage renal disease: Secondary | ICD-10-CM | POA: Diagnosis not present

## 2021-05-04 DIAGNOSIS — D509 Iron deficiency anemia, unspecified: Secondary | ICD-10-CM | POA: Diagnosis not present

## 2021-05-04 DIAGNOSIS — D631 Anemia in chronic kidney disease: Secondary | ICD-10-CM | POA: Diagnosis not present

## 2021-05-04 DIAGNOSIS — N186 End stage renal disease: Secondary | ICD-10-CM | POA: Diagnosis not present

## 2021-05-04 DIAGNOSIS — N2581 Secondary hyperparathyroidism of renal origin: Secondary | ICD-10-CM | POA: Diagnosis not present

## 2021-05-07 DIAGNOSIS — D631 Anemia in chronic kidney disease: Secondary | ICD-10-CM | POA: Diagnosis not present

## 2021-05-07 DIAGNOSIS — N186 End stage renal disease: Secondary | ICD-10-CM | POA: Diagnosis not present

## 2021-05-07 DIAGNOSIS — D509 Iron deficiency anemia, unspecified: Secondary | ICD-10-CM | POA: Diagnosis not present

## 2021-05-07 DIAGNOSIS — N2581 Secondary hyperparathyroidism of renal origin: Secondary | ICD-10-CM | POA: Diagnosis not present

## 2021-05-08 ENCOUNTER — Ambulatory Visit (HOSPITAL_BASED_OUTPATIENT_CLINIC_OR_DEPARTMENT_OTHER)
Admission: RE | Admit: 2021-05-08 | Discharge: 2021-05-08 | Disposition: A | Discharge status: Home / Self Care | Payer: Medicare HMO | Source: Ambulatory Visit | Attending: Family Medicine | Admitting: Family Medicine

## 2021-05-08 ENCOUNTER — Other Ambulatory Visit: Payer: Self-pay

## 2021-05-08 ENCOUNTER — Encounter (HOSPITAL_BASED_OUTPATIENT_CLINIC_OR_DEPARTMENT_OTHER): Payer: Self-pay

## 2021-05-08 DIAGNOSIS — Z1231 Encounter for screening mammogram for malignant neoplasm of breast: Secondary | ICD-10-CM | POA: Diagnosis not present

## 2021-05-08 DIAGNOSIS — Z780 Asymptomatic menopausal state: Secondary | ICD-10-CM | POA: Diagnosis not present

## 2021-05-08 DIAGNOSIS — M8589 Other specified disorders of bone density and structure, multiple sites: Secondary | ICD-10-CM | POA: Diagnosis not present

## 2021-05-08 DIAGNOSIS — E2839 Other primary ovarian failure: Secondary | ICD-10-CM

## 2021-05-09 DIAGNOSIS — R69 Illness, unspecified: Secondary | ICD-10-CM | POA: Diagnosis not present

## 2021-05-14 DIAGNOSIS — N186 End stage renal disease: Secondary | ICD-10-CM | POA: Diagnosis not present

## 2021-05-14 DIAGNOSIS — N2581 Secondary hyperparathyroidism of renal origin: Secondary | ICD-10-CM | POA: Diagnosis not present

## 2021-05-14 DIAGNOSIS — D631 Anemia in chronic kidney disease: Secondary | ICD-10-CM | POA: Diagnosis not present

## 2021-05-14 DIAGNOSIS — D509 Iron deficiency anemia, unspecified: Secondary | ICD-10-CM | POA: Diagnosis not present

## 2021-05-15 DIAGNOSIS — I671 Cerebral aneurysm, nonruptured: Secondary | ICD-10-CM | POA: Diagnosis not present

## 2021-05-15 DIAGNOSIS — I10 Essential (primary) hypertension: Secondary | ICD-10-CM | POA: Diagnosis not present

## 2021-05-16 DIAGNOSIS — D509 Iron deficiency anemia, unspecified: Secondary | ICD-10-CM | POA: Diagnosis not present

## 2021-05-16 DIAGNOSIS — N186 End stage renal disease: Secondary | ICD-10-CM | POA: Diagnosis not present

## 2021-05-16 DIAGNOSIS — D631 Anemia in chronic kidney disease: Secondary | ICD-10-CM | POA: Diagnosis not present

## 2021-05-16 DIAGNOSIS — N2581 Secondary hyperparathyroidism of renal origin: Secondary | ICD-10-CM | POA: Diagnosis not present

## 2021-05-17 ENCOUNTER — Other Ambulatory Visit: Payer: Self-pay | Admitting: Family Medicine

## 2021-05-17 DIAGNOSIS — R928 Other abnormal and inconclusive findings on diagnostic imaging of breast: Secondary | ICD-10-CM

## 2021-05-18 DIAGNOSIS — D631 Anemia in chronic kidney disease: Secondary | ICD-10-CM | POA: Diagnosis not present

## 2021-05-18 DIAGNOSIS — D509 Iron deficiency anemia, unspecified: Secondary | ICD-10-CM | POA: Diagnosis not present

## 2021-05-18 DIAGNOSIS — N2581 Secondary hyperparathyroidism of renal origin: Secondary | ICD-10-CM | POA: Diagnosis not present

## 2021-05-18 DIAGNOSIS — N186 End stage renal disease: Secondary | ICD-10-CM | POA: Diagnosis not present

## 2021-05-21 DIAGNOSIS — D631 Anemia in chronic kidney disease: Secondary | ICD-10-CM | POA: Diagnosis not present

## 2021-05-21 DIAGNOSIS — N186 End stage renal disease: Secondary | ICD-10-CM | POA: Diagnosis not present

## 2021-05-21 DIAGNOSIS — N2581 Secondary hyperparathyroidism of renal origin: Secondary | ICD-10-CM | POA: Diagnosis not present

## 2021-05-21 DIAGNOSIS — D509 Iron deficiency anemia, unspecified: Secondary | ICD-10-CM | POA: Diagnosis not present

## 2021-05-23 DIAGNOSIS — N2581 Secondary hyperparathyroidism of renal origin: Secondary | ICD-10-CM | POA: Diagnosis not present

## 2021-05-23 DIAGNOSIS — D509 Iron deficiency anemia, unspecified: Secondary | ICD-10-CM | POA: Diagnosis not present

## 2021-05-23 DIAGNOSIS — N186 End stage renal disease: Secondary | ICD-10-CM | POA: Diagnosis not present

## 2021-05-23 DIAGNOSIS — D631 Anemia in chronic kidney disease: Secondary | ICD-10-CM | POA: Diagnosis not present

## 2021-05-25 DIAGNOSIS — D509 Iron deficiency anemia, unspecified: Secondary | ICD-10-CM | POA: Diagnosis not present

## 2021-05-25 DIAGNOSIS — N186 End stage renal disease: Secondary | ICD-10-CM | POA: Diagnosis not present

## 2021-05-25 DIAGNOSIS — D631 Anemia in chronic kidney disease: Secondary | ICD-10-CM | POA: Diagnosis not present

## 2021-05-25 DIAGNOSIS — N2581 Secondary hyperparathyroidism of renal origin: Secondary | ICD-10-CM | POA: Diagnosis not present

## 2021-05-28 DIAGNOSIS — Z992 Dependence on renal dialysis: Secondary | ICD-10-CM | POA: Diagnosis not present

## 2021-05-28 DIAGNOSIS — N186 End stage renal disease: Secondary | ICD-10-CM | POA: Diagnosis not present

## 2021-05-30 DIAGNOSIS — N186 End stage renal disease: Secondary | ICD-10-CM | POA: Diagnosis not present

## 2021-05-30 DIAGNOSIS — D631 Anemia in chronic kidney disease: Secondary | ICD-10-CM | POA: Diagnosis not present

## 2021-05-30 DIAGNOSIS — D509 Iron deficiency anemia, unspecified: Secondary | ICD-10-CM | POA: Diagnosis not present

## 2021-05-30 DIAGNOSIS — N2581 Secondary hyperparathyroidism of renal origin: Secondary | ICD-10-CM | POA: Diagnosis not present

## 2021-05-31 ENCOUNTER — Other Ambulatory Visit: Payer: Self-pay | Admitting: Family Medicine

## 2021-05-31 DIAGNOSIS — M329 Systemic lupus erythematosus, unspecified: Secondary | ICD-10-CM

## 2021-06-01 DIAGNOSIS — N186 End stage renal disease: Secondary | ICD-10-CM | POA: Diagnosis not present

## 2021-06-01 DIAGNOSIS — N2581 Secondary hyperparathyroidism of renal origin: Secondary | ICD-10-CM | POA: Diagnosis not present

## 2021-06-01 DIAGNOSIS — D509 Iron deficiency anemia, unspecified: Secondary | ICD-10-CM | POA: Diagnosis not present

## 2021-06-01 DIAGNOSIS — D631 Anemia in chronic kidney disease: Secondary | ICD-10-CM | POA: Diagnosis not present

## 2021-06-04 DIAGNOSIS — D631 Anemia in chronic kidney disease: Secondary | ICD-10-CM | POA: Diagnosis not present

## 2021-06-04 DIAGNOSIS — D509 Iron deficiency anemia, unspecified: Secondary | ICD-10-CM | POA: Diagnosis not present

## 2021-06-04 DIAGNOSIS — N186 End stage renal disease: Secondary | ICD-10-CM | POA: Diagnosis not present

## 2021-06-04 DIAGNOSIS — N2581 Secondary hyperparathyroidism of renal origin: Secondary | ICD-10-CM | POA: Diagnosis not present

## 2021-06-05 DIAGNOSIS — H04123 Dry eye syndrome of bilateral lacrimal glands: Secondary | ICD-10-CM | POA: Diagnosis not present

## 2021-06-05 DIAGNOSIS — H5213 Myopia, bilateral: Secondary | ICD-10-CM | POA: Diagnosis not present

## 2021-06-05 DIAGNOSIS — H5203 Hypermetropia, bilateral: Secondary | ICD-10-CM | POA: Diagnosis not present

## 2021-06-05 DIAGNOSIS — H2513 Age-related nuclear cataract, bilateral: Secondary | ICD-10-CM | POA: Diagnosis not present

## 2021-06-06 DIAGNOSIS — D631 Anemia in chronic kidney disease: Secondary | ICD-10-CM | POA: Diagnosis not present

## 2021-06-06 DIAGNOSIS — N186 End stage renal disease: Secondary | ICD-10-CM | POA: Diagnosis not present

## 2021-06-06 DIAGNOSIS — N2581 Secondary hyperparathyroidism of renal origin: Secondary | ICD-10-CM | POA: Diagnosis not present

## 2021-06-06 DIAGNOSIS — D509 Iron deficiency anemia, unspecified: Secondary | ICD-10-CM | POA: Diagnosis not present

## 2021-06-07 ENCOUNTER — Telehealth: Payer: Self-pay | Admitting: Family Medicine

## 2021-06-07 NOTE — Telephone Encounter (Signed)
Patient called back stating she had a missed call from [place], I informed her that it was to schedule a Medicare wellness visit. She stated that she doesn't understand why she has to do one, that she has to see her doctors and [place] is just trying to waste her time. I tried to explain to the patient what the call was about but she was upset and hung up.

## 2021-06-07 NOTE — Telephone Encounter (Signed)
Left message for patient to call back and schedule Medicare Annual Wellness Visit (AWV) in office.  ° °If not able to come in office, please offer to do virtually or by telephone.  Left office number and my jabber #[phone]. ° °Due for AWVI ° °Please schedule at anytime with Nurse Health Advisor. °  °

## 2021-06-11 DIAGNOSIS — D631 Anemia in chronic kidney disease: Secondary | ICD-10-CM | POA: Diagnosis not present

## 2021-06-11 DIAGNOSIS — D509 Iron deficiency anemia, unspecified: Secondary | ICD-10-CM | POA: Diagnosis not present

## 2021-06-11 DIAGNOSIS — N186 End stage renal disease: Secondary | ICD-10-CM | POA: Diagnosis not present

## 2021-06-11 DIAGNOSIS — N2581 Secondary hyperparathyroidism of renal origin: Secondary | ICD-10-CM | POA: Diagnosis not present

## 2021-06-12 ENCOUNTER — Ambulatory Visit: Admission: RE | Admit: 2021-06-12 | Payer: Medicare HMO | Source: Ambulatory Visit

## 2021-06-12 ENCOUNTER — Ambulatory Visit
Admission: RE | Admit: 2021-06-12 | Discharge: 2021-06-12 | Disposition: A | Discharge status: Home / Self Care | Payer: Medicare HMO | Source: Ambulatory Visit | Attending: Family Medicine | Admitting: Family Medicine

## 2021-06-12 ENCOUNTER — Ambulatory Visit: Payer: Medicare HMO

## 2021-06-12 ENCOUNTER — Telehealth: Payer: Self-pay

## 2021-06-12 ENCOUNTER — Other Ambulatory Visit: Payer: Self-pay

## 2021-06-12 DIAGNOSIS — R928 Other abnormal and inconclusive findings on diagnostic imaging of breast: Secondary | ICD-10-CM

## 2021-06-12 DIAGNOSIS — R922 Inconclusive mammogram: Secondary | ICD-10-CM | POA: Diagnosis not present

## 2021-06-12 DIAGNOSIS — N6489 Other specified disorders of breast: Secondary | ICD-10-CM | POA: Diagnosis not present

## 2021-06-12 NOTE — Progress Notes (Deleted)
Subjective:   Karen Carr is a 65 y.o. female who presents for an Initial Medicare Annual Wellness Visit.  I connected with Klaira today by telephone and verified that I am speaking with the correct person using two identifiers. Location patient: home Location provider: work Persons participating in the virtual visit: patient, Marine scientist.    I discussed the limitations, risks, security and privacy concerns of performing an evaluation and management service by telephone and the availability of in person appointments. I also discussed with the patient that there may be a patient responsible charge related to this service. The patient expressed understanding and verbally consented to this telephonic visit.    Interactive audio and video telecommunications were attempted between this provider and patient, however failed, due to patient having technical difficulties OR patient did not have access to video capability.  We continued and completed visit with audio only.  Some vital signs may be absent or patient reported.   Time Spent with patient on telephone encounter: *** minutes   Review of Systems    ***       Objective:    There were no vitals filed for this visit. There is no height or weight on file to calculate BMI.  Advanced Directives 02/04/2018  Does Patient Have a Medical Advance Directive? No  Would patient like information on creating a medical advance directive? No - Patient declined    Current Medications (verified) Outpatient Encounter Medications as of 06/12/2021  Medication Sig   amLODipine (NORVASC) 10 MG tablet Take 1 tablet (10 mg total) by mouth daily.   cloNIDine (CATAPRES) 0.1 MG tablet Take 1 tablet (0.1 mg total) by mouth 2 (two) times daily.   hydrALAZINE (APRESOLINE) 50 MG tablet Take 1 tablet (50 mg total) by mouth 3 (three) times daily.   hydroxychloroquine (PLAQUENIL) 200 MG tablet Take 1 tablet by mouth twice daily   lisinopril (ZESTRIL) 20 MG tablet  Take 1 tab daily at 3 PM   metoprolol tartrate (LOPRESSOR) 50 MG tablet Take 1 tab daily at 3 PM with a 100 mg tab for a total of 150 mg daily.   sertraline (ZOLOFT) 50 MG tablet Take 1 tablet by mouth once daily   No facility-administered encounter medications on file as of 06/12/2021.    Allergies (verified) Patient has no known allergies.   History: Past Medical History:  Diagnosis Date   ESRD (end stage renal disease) ([place])    Gout    Hyperlipidemia    Hypertension    Major depressive disorder    SLE (systemic lupus erythematosus) ([facility])    Vitamin D deficiency    Past Surgical History:  Procedure Laterality Date   CESAREAN SECTION  1987   TUBAL LIGATION     Family History  Problem Relation Age of Onset   Diabetes Mother    Heart disease Mother    Tuberculosis Father    Social History   Socioeconomic History   Marital status: Married    Spouse name: Not on file   Number of children: Not on file   Years of education: Not on file   Highest education level: Not on file  Occupational History   Not on file  Tobacco Use   Smoking status: Every Day   Smokeless tobacco: Current  Substance and Sexual Activity   Alcohol use: Not Currently   Drug use: Not Currently   Sexual activity: Not on file  Other Topics Concern   Not on file  Social History  Narrative   Not on file   Social Determinants of Health   Financial Resource Strain: Not on file  Food Insecurity: Not on file  Transportation Needs: Not on file  Physical Activity: Not on file  Stress: Not on file  Social Connections: Not on file    Tobacco Counseling Ready to quit: Not Answered Counseling given: Not Answered   Clinical Intake:                 Diabetic?No         Activities of Daily Living In your present state of health, do you have any difficulty performing the following activities: 03/06/2021  Hearing? N  Vision? Y  Comment needs a check up with her eye doctor  Difficulty  concentrating or making decisions? N  Walking or climbing stairs? N  Dressing or bathing? N  Doing errands, shopping? N  Some recent data might be hidden    Patient Care Team: Shelda Pal, DO as PCP - General (Family Medicine)  Indicate any recent Medical Services you may have received from other than [facility] providers in the past year (date may be approximate).     Assessment:   This is a routine wellness examination for Karen Carr.  Hearing/Vision screen No results found.  Dietary issues and exercise activities discussed:     Goals Addressed   None    Depression Screen PHQ 2/9 Scores 03/06/2021  PHQ - 2 Score 2  PHQ- 9 Score 8    Fall Risk Fall Risk  03/06/2021  Falls in the past year? 0  Number falls in past yr: 0  Injury with Fall? 0  Risk for fall due to : No Fall Risks  Follow up Falls evaluation completed    [place]:  Any stairs in or around the home? {YES/NO:21197} If so, are there any without handrails? {YES/NO:21197} Home free of loose throw rugs in walkways, pet beds, electrical cords, etc? {YES/NO:21197} Adequate lighting in your home to reduce risk of falls? {YES/NO:21197}  ASSISTIVE DEVICES UTILIZED TO PREVENT FALLS:  Life alert? {YES/NO:21197} Use of a cane, walker or w/c? {YES/NO:21197} Grab bars in the bathroom? {YES/NO:21197} Shower chair or bench in shower? {YES/NO:21197} Elevated toilet seat or a handicapped toilet? {YES/NO:21197}  TIMED UP AND GO:  Was the test performed? {YES/NO:21197}.  Length of time to ambulate 10 feet: *** sec.   {Appearance of OACZ:6606301}  Cognitive Function:        Immunizations Immunization History  Administered Date(s) Administered   Influenza Whole 05/05/2014   PFIZER(Purple Top)SARS-COV-2 Vaccination 10/22/2019, 11/16/2019   PNEUMOCOCCAL CONJUGATE-20 03/06/2021   Pneumococcal Conjugate-13 01/10/2014   Tdap 01/10/2014    TDAP status: Up to date  {Flu  Vaccine status:2101806}  Pneumococcal vaccine status: Up to date  {Covid-19 vaccine status:2101808}  Qualifies for Shingles Vaccine? Yes   Zostavax completed No   Shingrix Completed?: No.    Education has been provided regarding the importance of this vaccine. Patient has been advised to call insurance company to determine out of pocket expense if they have not yet received this vaccine. Advised may also receive vaccine at local pharmacy or Health Dept. Verbalized acceptance and understanding.  Screening Tests Health Maintenance  Topic Date Due   Zoster Vaccines- Shingrix (1 of 2) Never done   COLONOSCOPY (Pts 45-49yrs Insurance coverage will need to be confirmed)  Never done   COVID-19 Vaccine (3 - Pfizer risk series) 12/14/2019   INFLUENZA VACCINE  02/26/2021  MAMMOGRAM  05/09/2023   TETANUS/TDAP  01/11/2024   Pneumonia Vaccine 78+ Years old  Completed   DEXA SCAN  Completed   Hepatitis C Screening  Completed   HPV VACCINES  Aged Out    Health Maintenance  Health Maintenance Due  Topic Date Due   Zoster Vaccines- Shingrix (1 of 2) Never done   COLONOSCOPY (Pts 45-32yrs Insurance coverage will need to be confirmed)  Never done   COVID-19 Vaccine (3 - Pfizer risk series) 12/14/2019   INFLUENZA VACCINE  02/26/2021    {Colorectal cancer screening:2101809}  Mammogram status: Completed bilateral 05/08/2021. Repeat every year Diagnostic mammo & U/S scheduled for today.  Bone Density status: Completed 05/08/2021. Results reflect: Bone density results: OSTEOPENIA. Repeat every 2 years.  Lung Cancer Screening: (Low Dose CT Chest recommended if Age 39-80 years, 30 pack-year currently smoking OR have quit w/in 15years.) {DOES NOT does:27190::"does not"} qualify.   Lung Cancer Screening Referral: ***  Additional Screening:  Hepatitis C Screening: Completed 03/06/2021  Vision Screening: Recommended annual ophthalmology exams for early detection of glaucoma and other disorders of  the eye. Is the patient up to date with their annual eye exam?  {YES/NO:21197} Who is the provider or what is the name of the office in which the patient attends annual eye exams? *** If pt is not established with a provider, would they like to be referred to a provider to establish care? {YES/NO:21197}.   Dental Screening: Recommended annual dental exams for proper oral hygiene  Community Resource Referral / Chronic Care Management: CRR required this visit?  {YES/NO:21197}  CCM required this visit?  {YES/NO:21197}     Plan:     I have personally reviewed and noted the following in the patient's chart:   Medical and social history Use of alcohol, tobacco or illicit drugs  Current medications and supplements including opioid prescriptions. {Opioid Prescriptions:[phone]} Functional ability and status Nutritional status Physical activity Advanced directives List of other physicians Hospitalizations, surgeries, and ER visits in previous 12 months Vitals Screenings to include cognitive, depression, and falls Referrals and appointments  In addition, I have reviewed and discussed with patient certain preventive protocols, quality metrics, and best practice recommendations. A written personalized care plan for preventive services as well as general preventive health recommendations were provided to patient.   Due to this being a telephonic visit, the after visit summary with patients personalized plan was offered to patient via mail or my-chart. ***Patient declined at this time./ Patient would like to access on my-chart/ per request, patient was mailed a copy of AVS./ Patient preferred to pick up at office at next visit.   Marta Antu, LPN   90/30/0923  Nurse Health Advisor  Nurse Notes: ***

## 2021-06-12 NOTE — Telephone Encounter (Signed)
Patient had am 11:00 telephone appt for her Medicare Wellness exam. Attempted x 3 to reach patient. Left message for patient to call back to reschedule.

## 2021-06-13 DIAGNOSIS — D509 Iron deficiency anemia, unspecified: Secondary | ICD-10-CM | POA: Diagnosis not present

## 2021-06-13 DIAGNOSIS — D631 Anemia in chronic kidney disease: Secondary | ICD-10-CM | POA: Diagnosis not present

## 2021-06-13 DIAGNOSIS — N2581 Secondary hyperparathyroidism of renal origin: Secondary | ICD-10-CM | POA: Diagnosis not present

## 2021-06-13 DIAGNOSIS — N186 End stage renal disease: Secondary | ICD-10-CM | POA: Diagnosis not present

## 2021-06-15 DIAGNOSIS — N186 End stage renal disease: Secondary | ICD-10-CM | POA: Diagnosis not present

## 2021-06-15 DIAGNOSIS — D509 Iron deficiency anemia, unspecified: Secondary | ICD-10-CM | POA: Diagnosis not present

## 2021-06-15 DIAGNOSIS — D631 Anemia in chronic kidney disease: Secondary | ICD-10-CM | POA: Diagnosis not present

## 2021-06-15 DIAGNOSIS — N2581 Secondary hyperparathyroidism of renal origin: Secondary | ICD-10-CM | POA: Diagnosis not present

## 2021-06-17 DIAGNOSIS — N2581 Secondary hyperparathyroidism of renal origin: Secondary | ICD-10-CM | POA: Diagnosis not present

## 2021-06-17 DIAGNOSIS — D509 Iron deficiency anemia, unspecified: Secondary | ICD-10-CM | POA: Diagnosis not present

## 2021-06-17 DIAGNOSIS — D631 Anemia in chronic kidney disease: Secondary | ICD-10-CM | POA: Diagnosis not present

## 2021-06-17 DIAGNOSIS — N186 End stage renal disease: Secondary | ICD-10-CM | POA: Diagnosis not present

## 2021-06-19 ENCOUNTER — Ambulatory Visit: Payer: Medicare HMO

## 2021-06-19 DIAGNOSIS — N2581 Secondary hyperparathyroidism of renal origin: Secondary | ICD-10-CM | POA: Diagnosis not present

## 2021-06-19 DIAGNOSIS — N186 End stage renal disease: Secondary | ICD-10-CM | POA: Diagnosis not present

## 2021-06-19 DIAGNOSIS — D631 Anemia in chronic kidney disease: Secondary | ICD-10-CM | POA: Diagnosis not present

## 2021-06-19 DIAGNOSIS — D509 Iron deficiency anemia, unspecified: Secondary | ICD-10-CM | POA: Diagnosis not present

## 2021-06-19 NOTE — Progress Notes (Deleted)
Subjective:   Karen Carr is a 65 y.o. female who presents for an Initial Medicare Annual Wellness Visit.  I connected with Karen Carr today by telephone and verified that I am speaking with the correct person using two identifiers. Location patient: home Location provider: work Persons participating in the virtual visit: patient, Marine scientist.    I discussed the limitations, risks, security and privacy concerns of performing an evaluation and management service by telephone and the availability of in person appointments. I also discussed with the patient that there may be a patient responsible charge related to this service. The patient expressed understanding and verbally consented to this telephonic visit.    Interactive audio and video telecommunications were attempted between this provider and patient, however failed, due to patient having technical difficulties OR patient did not have access to video capability.  We continued and completed visit with audio only.  Some vital signs may be absent or patient reported.   Time Spent with patient on telephone encounter: *** minutes   Review of Systems    ***       Objective:    There were no vitals filed for this visit. There is no height or weight on file to calculate BMI.  Advanced Directives 02/04/2018  Does Patient Have a Medical Advance Directive? No  Would patient like information on creating a medical advance directive? No - Patient declined    Current Medications (verified) Outpatient Encounter Medications as of 06/19/2021  Medication Sig   amLODipine (NORVASC) 10 MG tablet Take 1 tablet (10 mg total) by mouth daily.   cloNIDine (CATAPRES) 0.1 MG tablet Take 1 tablet (0.1 mg total) by mouth 2 (two) times daily.   hydrALAZINE (APRESOLINE) 50 MG tablet Take 1 tablet (50 mg total) by mouth 3 (three) times daily.   hydroxychloroquine (PLAQUENIL) 200 MG tablet Take 1 tablet by mouth twice daily   lisinopril (ZESTRIL) 20 MG tablet  Take 1 tab daily at 3 PM   metoprolol tartrate (LOPRESSOR) 50 MG tablet Take 1 tab daily at 3 PM with a 100 mg tab for a total of 150 mg daily.   sertraline (ZOLOFT) 50 MG tablet Take 1 tablet by mouth once daily   No facility-administered encounter medications on file as of 06/19/2021.    Allergies (verified) Patient has no known allergies.   History: Past Medical History:  Diagnosis Date   ESRD (end stage renal disease) ([place])    Gout    Hyperlipidemia    Hypertension    Major depressive disorder    SLE (systemic lupus erythematosus) ([facility])    Vitamin D deficiency    Past Surgical History:  Procedure Laterality Date   CESAREAN SECTION  1987   TUBAL LIGATION     Family History  Problem Relation Age of Onset   Diabetes Mother    Heart disease Mother    Tuberculosis Father    Social History   Socioeconomic History   Marital status: Married    Spouse name: Not on file   Number of children: Not on file   Years of education: Not on file   Highest education level: Not on file  Occupational History   Not on file  Tobacco Use   Smoking status: Every Day   Smokeless tobacco: Current  Substance and Sexual Activity   Alcohol use: Not Currently   Drug use: Not Currently   Sexual activity: Not on file  Other Topics Concern   Not on file  Social History  Narrative   Not on file   Social Determinants of Health   Financial Resource Strain: Not on file  Food Insecurity: Not on file  Transportation Needs: Not on file  Physical Activity: Not on file  Stress: Not on file  Social Connections: Not on file    Tobacco Counseling Ready to quit: Not Answered Counseling given: Not Answered   Clinical Intake:                 Diabetic?No         Activities of Daily Living In your present state of health, do you have any difficulty performing the following activities: 03/06/2021  Hearing? N  Vision? Y  Comment needs a check up with her eye doctor  Difficulty  concentrating or making decisions? N  Walking or climbing stairs? N  Dressing or bathing? N  Doing errands, shopping? N  Some recent data might be hidden    Patient Care Team: Shelda Pal, DO as PCP - General (Family Medicine)  Indicate any recent Medical Services you may have received from other than [facility] providers in the past year (date may be approximate).     Assessment:   This is a routine wellness examination for Karen Carr.  Hearing/Vision screen No results found.  Dietary issues and exercise activities discussed:     Goals Addressed   None    Depression Screen PHQ 2/9 Scores 03/06/2021  PHQ - 2 Score 2  PHQ- 9 Score 8    Fall Risk Fall Risk  03/06/2021  Falls in the past year? 0  Number falls in past yr: 0  Injury with Fall? 0  Risk for fall due to : No Fall Risks  Follow up Falls evaluation completed    [name]:  Any stairs in or around the home? {YES/NO:21197} If so, are there any without handrails? {YES/NO:21197} Home free of loose throw rugs in walkways, pet beds, electrical cords, etc? {YES/NO:21197} Adequate lighting in your home to reduce risk of falls? {YES/NO:21197}  ASSISTIVE DEVICES UTILIZED TO PREVENT FALLS:  Life alert? {YES/NO:21197} Use of a cane, walker or w/c? {YES/NO:21197} Grab bars in the bathroom? {YES/NO:21197} Shower chair or bench in shower? {YES/NO:21197} Elevated toilet seat or a handicapped toilet? {YES/NO:21197}  TIMED UP AND GO:  Was the test performed? {YES/NO:21197}.  Length of time to ambulate 10 feet: *** sec.   {Appearance of FXTK:2409735}  Cognitive Function:        Immunizations Immunization History  Administered Date(s) Administered   Hepatitis B, adult 02/15/2015, 03/14/2015, 05/12/2015, 09/21/2015, 03/25/2016, 05/02/2016   Influenza Whole 05/05/2014   PFIZER(Purple Top)SARS-COV-2 Vaccination 10/22/2019, 11/16/2019   PNEUMOCOCCAL CONJUGATE-20 03/06/2021    Pneumococcal Conjugate-13 01/10/2014   Pneumococcal Polysaccharide-23 05/04/2015   Tdap 01/10/2014    TDAP status: Up to date  {Flu Vaccine status:2101806}  Pneumococcal vaccine status: Up to date  {Covid-19 vaccine status:2101808}  Qualifies for Shingles Vaccine? Yes   Zostavax completed No   Shingrix Completed?: No.    Education has been provided regarding the importance of this vaccine. Patient has been advised to call insurance company to determine out of pocket expense if they have not yet received this vaccine. Advised may also receive vaccine at local pharmacy or Health Dept. Verbalized acceptance and understanding.  Screening Tests Health Maintenance  Topic Date Due   Zoster Vaccines- Shingrix (1 of 2) Never done   COLONOSCOPY (Pts 45-26yrs Insurance coverage will need to be confirmed)  Never done  COVID-19 Vaccine (3 - Pfizer risk series) 12/14/2019   INFLUENZA VACCINE  02/26/2021   MAMMOGRAM  05/09/2023   TETANUS/TDAP  01/11/2024   Pneumonia Vaccine 72+ Years old  Completed   DEXA SCAN  Completed   Hepatitis C Screening  Completed   HPV VACCINES  Aged Out    Health Maintenance  Health Maintenance Due  Topic Date Due   Zoster Vaccines- Shingrix (1 of 2) Never done   COLONOSCOPY (Pts 45-18yrs Insurance coverage will need to be confirmed)  Never done   COVID-19 Vaccine (3 - Pfizer risk series) 12/14/2019   INFLUENZA VACCINE  02/26/2021    {Colorectal cancer screening:2101809}  Mammogram status: Completed bilateral 05/08/2021. Repeat every year  Bone Density status: Completed 05/08/2021. Results reflect: Bone density results: OSTEOPENIA. Repeat every 2 years.  Lung Cancer Screening: (Low Dose CT Chest recommended if Age 37-80 years, 30 pack-year currently smoking OR have quit w/in 15years.) {DOES NOT does:27190::"does not"} qualify.   Lung Cancer Screening Referral: ***  Additional Screening:  Hepatitis C Screening: Completed 03/06/2021  Vision Screening:  Recommended annual ophthalmology exams for early detection of glaucoma and other disorders of the eye. Is the patient up to date with their annual eye exam?  {YES/NO:21197} Who is the provider or what is the name of the office in which the patient attends annual eye exams? *** If pt is not established with a provider, would they like to be referred to a provider to establish care? {YES/NO:21197}.   Dental Screening: Recommended annual dental exams for proper oral hygiene  Community Resource Referral / Chronic Care Management: CRR required this visit?  {YES/NO:21197}  CCM required this visit?  {YES/NO:21197}     Plan:     I have personally reviewed and noted the following in the patient's chart:   Medical and social history Use of alcohol, tobacco or illicit drugs  Current medications and supplements including opioid prescriptions. {Opioid Prescriptions:[phone]} Functional ability and status Nutritional status Physical activity Advanced directives List of other physicians Hospitalizations, surgeries, and ER visits in previous 12 months Vitals Screenings to include cognitive, depression, and falls Referrals and appointments  In addition, I have reviewed and discussed with patient certain preventive protocols, quality metrics, and best practice recommendations. A written personalized care plan for preventive services as well as general preventive health recommendations were provided to patient.   Due to this being a telephonic visit, the after visit summary with patients personalized plan was offered to patient via mail or my-chart. ***Patient declined at this time./ Patient would like to access on my-chart/ per request, patient was mailed a copy of AVS./ Patient preferred to pick up at office at next visit.   Marta Antu, LPN   [id number]  Nurse Health Advisor  Nurse Notes: ***

## 2021-06-22 DIAGNOSIS — N186 End stage renal disease: Secondary | ICD-10-CM | POA: Diagnosis not present

## 2021-06-22 DIAGNOSIS — D509 Iron deficiency anemia, unspecified: Secondary | ICD-10-CM | POA: Diagnosis not present

## 2021-06-22 DIAGNOSIS — D631 Anemia in chronic kidney disease: Secondary | ICD-10-CM | POA: Diagnosis not present

## 2021-06-22 DIAGNOSIS — N2581 Secondary hyperparathyroidism of renal origin: Secondary | ICD-10-CM | POA: Diagnosis not present

## 2021-06-25 ENCOUNTER — Other Ambulatory Visit: Payer: Self-pay | Admitting: Family Medicine

## 2021-06-25 ENCOUNTER — Telehealth: Payer: Self-pay

## 2021-06-25 ENCOUNTER — Ambulatory Visit: Payer: Medicare HMO

## 2021-06-25 DIAGNOSIS — F418 Other specified anxiety disorders: Secondary | ICD-10-CM

## 2021-06-25 NOTE — Telephone Encounter (Signed)
Patient had a 3:00 appt for a medicare wellness visit(phone visit). Unable to reach patient after 4 attempts. This is the 3rd attempted medicare wellness visit. Patient has been a no show each time.

## 2021-06-25 NOTE — Progress Notes (Deleted)
Subjective:   Karen Carr is a 65 y.o. female who presents for an Initial Medicare Annual Wellness Visit.  I connected with *** today by telephone and verified that I am speaking with the correct person using two identifiers. Location patient: home Location provider: work Persons participating in the virtual visit: patient, Marine scientist.    I discussed the limitations, risks, security and privacy concerns of performing an evaluation and management service by telephone and the availability of in person appointments. I also discussed with the patient that there may be a patient responsible charge related to this service. The patient expressed understanding and verbally consented to this telephonic visit.    Interactive audio and video telecommunications were attempted between this provider and patient, however failed, due to patient having technical difficulties OR patient did not have access to video capability.  We continued and completed visit with audio only.  Some vital signs may be absent or patient reported.   Time Spent with patient on telephone encounter: *** minutes   Review of Systems    ***       Objective:    There were no vitals filed for this visit. There is no height or weight on file to calculate BMI.  Advanced Directives 02/04/2018  Does Patient Have a Medical Advance Directive? No  Would patient like information on creating a medical advance directive? No - Patient declined    Current Medications (verified) Outpatient Encounter Medications as of 06/25/2021  Medication Sig   amLODipine (NORVASC) 10 MG tablet Take 1 tablet (10 mg total) by mouth daily.   cloNIDine (CATAPRES) 0.1 MG tablet Take 1 tablet (0.1 mg total) by mouth 2 (two) times daily.   hydrALAZINE (APRESOLINE) 50 MG tablet Take 1 tablet (50 mg total) by mouth 3 (three) times daily.   hydroxychloroquine (PLAQUENIL) 200 MG tablet Take 1 tablet by mouth twice daily   lisinopril (ZESTRIL) 20 MG tablet Take  1 tab daily at 3 PM   metoprolol tartrate (LOPRESSOR) 50 MG tablet Take 1 tab daily at 3 PM with a 100 mg tab for a total of 150 mg daily.   sertraline (ZOLOFT) 50 MG tablet Take 1 tablet by mouth once daily   No facility-administered encounter medications on file as of 06/25/2021.    Allergies (verified) Patient has no known allergies.   History: Past Medical History:  Diagnosis Date   ESRD (end stage renal disease) ([place])    Gout    Hyperlipidemia    Hypertension    Major depressive disorder    SLE (systemic lupus erythematosus) ([facility])    Vitamin D deficiency    Past Surgical History:  Procedure Laterality Date   CESAREAN SECTION  1987   TUBAL LIGATION     Family History  Problem Relation Age of Onset   Diabetes Mother    Heart disease Mother    Tuberculosis Father    Social History   Socioeconomic History   Marital status: Married    Spouse name: Not on file   Number of children: Not on file   Years of education: Not on file   Highest education level: Not on file  Occupational History   Not on file  Tobacco Use   Smoking status: Every Day   Smokeless tobacco: Current  Substance and Sexual Activity   Alcohol use: Not Currently   Drug use: Not Currently   Sexual activity: Not on file  Other Topics Concern   Not on file  Social History  Narrative   Not on file   Social Determinants of Health   Financial Resource Strain: Not on file  Food Insecurity: Not on file  Transportation Needs: Not on file  Physical Activity: Not on file  Stress: Not on file  Social Connections: Not on file    Tobacco Counseling Ready to quit: Not Answered Counseling given: Not Answered   Clinical Intake:                 Diabetic?No         Activities of Daily Living In your present state of health, do you have any difficulty performing the following activities: 03/06/2021  Hearing? N  Vision? Y  Comment needs a check up with her eye doctor  Difficulty  concentrating or making decisions? N  Walking or climbing stairs? N  Dressing or bathing? N  Doing errands, shopping? N  Some recent data might be hidden    Patient Care Team: Shelda Pal, DO as PCP - General (Family Medicine)  Indicate any recent Medical Services you may have received from other than [facility] providers in the past year (date may be approximate).     Assessment:   This is a routine wellness examination for Karen Carr.  Hearing/Vision screen No results found.  Dietary issues and exercise activities discussed:     Goals Addressed   None    Depression Screen PHQ 2/9 Scores 03/06/2021  PHQ - 2 Score 2  PHQ- 9 Score 8    Fall Risk Fall Risk  03/06/2021  Falls in the past year? 0  Number falls in past yr: 0  Injury with Fall? 0  Risk for fall due to : No Fall Risks  Follow up Falls evaluation completed    [place]:  Any stairs in or around the home? {YES/NO:21197} If so, are there any without handrails? {YES/NO:21197} Home free of loose throw rugs in walkways, pet beds, electrical cords, etc? {YES/NO:21197} Adequate lighting in your home to reduce risk of falls? {YES/NO:21197}  ASSISTIVE DEVICES UTILIZED TO PREVENT FALLS:  Life alert? {YES/NO:21197} Use of a cane, walker or w/c? {YES/NO:21197} Grab bars in the bathroom? {YES/NO:21197} Shower chair or bench in shower? {YES/NO:21197} Elevated toilet seat or a handicapped toilet? {YES/NO:21197}  TIMED UP AND GO:  Was the test performed? {YES/NO:21197}.  Length of time to ambulate 10 feet: *** sec.   {Appearance of SNKN:3976734}  Cognitive Function:        Immunizations Immunization History  Administered Date(s) Administered   Hepatitis B, adult 02/15/2015, 03/14/2015, 05/12/2015, 09/21/2015, 03/25/2016, 05/02/2016   Influenza Whole 05/05/2014   PFIZER(Purple Top)SARS-COV-2 Vaccination 10/22/2019, 11/16/2019   PNEUMOCOCCAL CONJUGATE-20 03/06/2021    Pneumococcal Conjugate-13 01/10/2014   Pneumococcal Polysaccharide-23 05/04/2015   Tdap 01/10/2014    TDAP status: Up to date  {Flu Vaccine status:2101806}  Pneumococcal vaccine status: Up to date  {Covid-19 vaccine status:2101808}  Qualifies for Shingles Vaccine? Yes   Zostavax completed No   Shingrix Completed?: No.    Education has been provided regarding the importance of this vaccine. Patient has been advised to call insurance company to determine out of pocket expense if they have not yet received this vaccine. Advised may also receive vaccine at local pharmacy or Health Dept. Verbalized acceptance and understanding.  Screening Tests Health Maintenance  Topic Date Due   Zoster Vaccines- Shingrix (1 of 2) Never done   COLONOSCOPY (Pts 45-76yrs Insurance coverage will need to be confirmed)  Never done  COVID-19 Vaccine (3 - Pfizer risk series) 12/14/2019   INFLUENZA VACCINE  02/26/2021   MAMMOGRAM  05/09/2023   TETANUS/TDAP  01/11/2024   Pneumonia Vaccine 72+ Years old  Completed   DEXA SCAN  Completed   Hepatitis C Screening  Completed   HPV VACCINES  Aged Out    Health Maintenance  Health Maintenance Due  Topic Date Due   Zoster Vaccines- Shingrix (1 of 2) Never done   COLONOSCOPY (Pts 45-91yrs Insurance coverage will need to be confirmed)  Never done   COVID-19 Vaccine (3 - Pfizer risk series) 12/14/2019   INFLUENZA VACCINE  02/26/2021    {Colorectal cancer screening:2101809}  Mammogram status: Completed 05/08/2021. Repeat every year  Bone Density status: Completed 05/08/2021. Results reflect: Bone density results: OSTEOPENIA. Repeat every 2 years.  Lung Cancer Screening: (Low Dose CT Chest recommended if Age 2-80 years, 30 pack-year currently smoking OR have quit w/in 15years.) {DOES NOT does:27190::"does not"} qualify.   Lung Cancer Screening Referral: ***  Additional Screening:  Hepatitis C Screening: Completed 03/06/2021  Vision Screening:  Recommended annual ophthalmology exams for early detection of glaucoma and other disorders of the eye. Is the patient up to date with their annual eye exam?  {YES/NO:21197} Who is the provider or what is the name of the office in which the patient attends annual eye exams? *** If pt is not established with a provider, would they like to be referred to a provider to establish care? {YES/NO:21197}.   Dental Screening: Recommended annual dental exams for proper oral hygiene  Community Resource Referral / Chronic Care Management: CRR required this visit?  {YES/NO:21197}  CCM required this visit?  {YES/NO:21197}     Plan:     I have personally reviewed and noted the following in the patient's chart:   Medical and social history Use of alcohol, tobacco or illicit drugs  Current medications and supplements including opioid prescriptions. {Opioid Prescriptions:3673031854} Functional ability and status Nutritional status Physical activity Advanced directives List of other physicians Hospitalizations, surgeries, and ER visits in previous 12 months Vitals Screenings to include cognitive, depression, and falls Referrals and appointments  In addition, I have reviewed and discussed with patient certain preventive protocols, quality metrics, and best practice recommendations. A written personalized care plan for preventive services as well as general preventive health recommendations were provided to patient.   Due to this being a telephonic visit, the after visit summary with patients personalized plan was offered to patient via mail or my-chart. ***Patient declined at this time./ Patient would like to access on my-chart/ per request, patient was mailed a copy of AVS./ Patient preferred to pick up at office at next visit.   Marta Antu, LPN   [id number]/93/5701  Nurse Health Advisor  Nurse Notes: ***

## 2021-06-27 DIAGNOSIS — Z992 Dependence on renal dialysis: Secondary | ICD-10-CM | POA: Diagnosis not present

## 2021-06-27 DIAGNOSIS — D631 Anemia in chronic kidney disease: Secondary | ICD-10-CM | POA: Diagnosis not present

## 2021-06-27 DIAGNOSIS — N186 End stage renal disease: Secondary | ICD-10-CM | POA: Diagnosis not present

## 2021-06-27 DIAGNOSIS — N2581 Secondary hyperparathyroidism of renal origin: Secondary | ICD-10-CM | POA: Diagnosis not present

## 2021-06-27 DIAGNOSIS — D509 Iron deficiency anemia, unspecified: Secondary | ICD-10-CM | POA: Diagnosis not present

## 2021-06-29 DIAGNOSIS — N2581 Secondary hyperparathyroidism of renal origin: Secondary | ICD-10-CM | POA: Diagnosis not present

## 2021-06-29 DIAGNOSIS — D631 Anemia in chronic kidney disease: Secondary | ICD-10-CM | POA: Diagnosis not present

## 2021-06-29 DIAGNOSIS — D509 Iron deficiency anemia, unspecified: Secondary | ICD-10-CM | POA: Diagnosis not present

## 2021-06-29 DIAGNOSIS — N186 End stage renal disease: Secondary | ICD-10-CM | POA: Diagnosis not present

## 2021-07-02 DIAGNOSIS — N2581 Secondary hyperparathyroidism of renal origin: Secondary | ICD-10-CM | POA: Diagnosis not present

## 2021-07-02 DIAGNOSIS — D631 Anemia in chronic kidney disease: Secondary | ICD-10-CM | POA: Diagnosis not present

## 2021-07-02 DIAGNOSIS — N186 End stage renal disease: Secondary | ICD-10-CM | POA: Diagnosis not present

## 2021-07-02 DIAGNOSIS — D509 Iron deficiency anemia, unspecified: Secondary | ICD-10-CM | POA: Diagnosis not present

## 2021-07-03 NOTE — Progress Notes (Signed)
Subjective:   Karen Carr is a 65 y.o. female who presents for an Initial Medicare Annual Wellness Visit.  I connected with [name] today by telephone and verified that I am speaking with the correct person using two identifiers. Location patient: home Location provider: work Persons participating in the virtual visit: patient, Marine scientist.    I discussed the limitations, risks, security and privacy concerns of performing an evaluation and management service by telephone and the availability of in person appointments. I also discussed with the patient that there may be a patient responsible charge related to this service. The patient expressed understanding and verbally consented to this telephonic visit.    Interactive audio and video telecommunications were attempted between this provider and patient, however failed, due to patient having technical difficulties OR patient did not have access to video capability.  We continued and completed visit with audio only.  Some vital signs may be absent or patient reported.   Time Spent with patient on telephone encounter: 35 minutes   Review of Systems     Cardiac Risk Factors include: advanced age (>61men, >69 women);hypertension;dyslipidemia     Objective:    Today's Vitals   07/05/21 1422 07/05/21 1423  Weight: 157 lb (71.2 kg)   Height: 5\' 6"  (1.676 m)   PainSc:  5    Body mass index is 25.34 kg/m.  Advanced Directives 07/05/2021 02/04/2018  Does Patient Have a Medical Advance Directive? No No  Would patient like information on creating a medical advance directive? Yes (MAU/Ambulatory/Procedural Areas - Information given) No - Patient declined    Current Medications (verified) Outpatient Encounter Medications as of 07/05/2021  Medication Sig   amLODipine (NORVASC) 10 MG tablet Take 1 tablet (10 mg total) by mouth daily.   cloNIDine (CATAPRES) 0.1 MG tablet Take 1 tablet (0.1 mg total) by mouth 2 (two) times daily.   hydrALAZINE  (APRESOLINE) 50 MG tablet Take 1 tablet (50 mg total) by mouth 3 (three) times daily.   hydroxychloroquine (PLAQUENIL) 200 MG tablet Take 1 tablet by mouth twice daily   lisinopril (ZESTRIL) 20 MG tablet Take 1 tab daily at 3 PM   metoprolol tartrate (LOPRESSOR) 50 MG tablet Take 1 tab daily at 3 PM with a 100 mg tab for a total of 150 mg daily.   sertraline (ZOLOFT) 50 MG tablet Take 1 tablet by mouth once daily   No facility-administered encounter medications on file as of 07/05/2021.    Allergies (verified) Patient has no known allergies.   History: Past Medical History:  Diagnosis Date   ESRD (end stage renal disease) ([facility])    Gout    Hyperlipidemia    Hypertension    Major depressive disorder    SLE (systemic lupus erythematosus) ([facility])    Vitamin D deficiency    Past Surgical History:  Procedure Laterality Date   CESAREAN SECTION  1987   TUBAL LIGATION     Family History  Problem Relation Age of Onset   Diabetes Mother    Heart disease Mother    Tuberculosis Father    Social History   Socioeconomic History   Marital status: Married    Spouse name: Not on file   Number of children: Not on file   Years of education: Not on file   Highest education level: Not on file  Occupational History   Not on file  Tobacco Use   Smoking status: Every Day    Packs/day: 0.25    Years: 15.00  Pack years: 3.75    Types: Cigarettes   Smokeless tobacco: Current  Substance and Sexual Activity   Alcohol use: Not Currently   Drug use: Not Currently   Sexual activity: Not on file  Other Topics Concern   Not on file  Social History Narrative   Not on file   Social Determinants of Health   Financial Resource Strain: Medium Risk   Difficulty of Paying Living Expenses: Somewhat hard  Food Insecurity: Food Insecurity Present   Worried About Running Out of Food in the Last Year: Sometimes true   Ran Out of Food in the Last Year: Never true  Transportation Needs: No  Transportation Needs   Lack of Transportation (Medical): No   Lack of Transportation (Non-Medical): No  Physical Activity: Inactive   Days of Exercise per Week: 0 days   Minutes of Exercise per Session: 0 min  Stress: No Stress Concern Present   Feeling of Stress : Only a little  Social Connections: Moderately Integrated   Frequency of Communication with Friends and Family: More than three times a week   Frequency of Social Gatherings with Friends and Family: Once a week   Attends Religious Services: 1 to 4 times per year   Active Member of [employer] or Organizations: No   Attends Music therapist: Never   Marital Status: Married    Tobacco Counseling Ready to quit: Not Answered Counseling given: Not Answered   Clinical Intake:  Pre-visit preparation completed: Yes  Pain : 0-10 Pain Score: 5  Pain Type: Chronic pain Pain Location: Back Pain Orientation: Lower Pain Onset: More than a month ago Pain Frequency: Intermittent     BMI - recorded: 25.34 Nutritional Status: BMI 25 -29 Overweight Nutritional Risks: None Diabetes: No  How often do you need to have someone help you when you read instructions, pamphlets, or other written materials from your doctor or pharmacy?: 1 - Never  Diabetic?No  Interpreter Needed?: No  Information entered by :: Caroleen Hamman LPN   Activities of Daily Living In your present state of health, do you have any difficulty performing the following activities: 07/05/2021 03/06/2021  Hearing? N N  Vision? N Y  Comment - needs a check up with her eye doctor  Difficulty concentrating or making decisions? Y N  Comment occasionally -  Walking or climbing stairs? N N  Dressing or bathing? N N  Doing errands, shopping? N N  Preparing Food and eating ? N -  Using the Toilet? N -  In the past six months, have you accidently leaked urine? N -  Do you have problems with loss of bowel control? N -  Managing your Medications? N -   Managing your Finances? N -  Housekeeping or managing your Housekeeping? N -  Some recent data might be hidden    Patient Care Team: Shelda Pal, DO as PCP - General (Family Medicine)  Indicate any recent Medical Services you may have received from other than [facility] providers in the past year (date may be approximate).     Assessment:   This is a routine wellness examination for Christia.  Hearing/Vision screen Hearing Screening - Comments:: C/o mild hearing loss Vision Screening - Comments:: Last eye exam-05/2021  Dietary issues and exercise activities discussed: Current Exercise Habits: The patient does not participate in regular exercise at present, Exercise limited by: None identified   Goals Addressed             This Visit's  Progress    Patient Stated       Continue to eat fruits & vegetables & increase activity       Depression Screen PHQ 2/9 Scores 07/05/2021 03/06/2021  PHQ - 2 Score 1 2  PHQ- 9 Score - 8    Fall Risk Fall Risk  07/05/2021 03/06/2021  Falls in the past year? 0 0  Number falls in past yr: 0 0  Injury with Fall? 0 0  Risk for fall due to : - No Fall Risks  Follow up Falls prevention discussed Falls evaluation completed    Standard:  Any stairs in or around the home? No  Home free of loose throw rugs in walkways, pet beds, electrical cords, etc? Yes  Adequate lighting in your home to reduce risk of falls? Yes   ASSISTIVE DEVICES UTILIZED TO PREVENT FALLS:  Life alert? No  Use of a cane, walker or w/c? No  Grab bars in the bathroom? No  Shower chair or bench in shower? Yes  Elevated toilet seat or a handicapped toilet? Yes   TIMED UP AND GO:  Was the test performed? No . Phone visit   Cognitive Function:Normal cognitive status assessed by this Nurse Health Advisor. No abnormalities found.          Immunizations Immunization History  Administered Date(s) Administered   Hepatitis B,  adult 02/15/2015, 03/14/2015, 05/12/2015, 09/21/2015, 03/25/2016, 05/02/2016   Influenza Whole 05/05/2014   PFIZER(Purple Top)SARS-COV-2 Vaccination 10/22/2019, 11/16/2019   PNEUMOCOCCAL CONJUGATE-20 03/06/2021   Pneumococcal Conjugate-13 01/10/2014   Pneumococcal Polysaccharide-23 05/04/2015   Tdap 01/10/2014    TDAP status: Up to date  Flu Vaccine status: Up to date per patient-specific date unknown  Pneumococcal vaccine status: Up to date  Covid-19 vaccine status: Declined, Education has been provided regarding the importance of this vaccine but patient still declined. Advised may receive this vaccine at local pharmacy or Health Dept.or vaccine clinic. Aware to provide a copy of the vaccination record if obtained from local pharmacy or Health Dept. Verbalized acceptance and understanding.  Qualifies for Shingles Vaccine? Yes   Zostavax completed No   Shingrix Completed?: No.    Education has been provided regarding the importance of this vaccine. Patient has been advised to call insurance company to determine out of pocket expense if they have not yet received this vaccine. Advised may also receive vaccine at local pharmacy or Health Dept. Verbalized acceptance and understanding.  Screening Tests Health Maintenance  Topic Date Due   Zoster Vaccines- Shingrix (1 of 2) Never done   COLONOSCOPY (Pts 45-22yrs Insurance coverage will need to be confirmed)  Never done   COVID-19 Vaccine (3 - Pfizer risk series) 12/14/2019   INFLUENZA VACCINE  02/26/2021   MAMMOGRAM  05/09/2023   TETANUS/TDAP  01/11/2024   Pneumonia Vaccine 34+ Years old  Completed   DEXA SCAN  Completed   Hepatitis C Screening  Completed   HPV VACCINES  Aged Out    Health Maintenance  Health Maintenance Due  Topic Date Due   Zoster Vaccines- Shingrix (1 of 2) Never done   COLONOSCOPY (Pts 45-43yrs Insurance coverage will need to be confirmed)  Never done   COVID-19 Vaccine (3 - Pfizer risk series) 12/14/2019    INFLUENZA VACCINE  02/26/2021    Colorectal cancer screening: Referral to GI placed today. Pt aware the office will call re: appt.  Mammogram status: Completed bilateral 05/08/2021. Repeat every year  Bone Density status:  Completed 05/08/2021. Results reflect: Bone density results: OSTEOPENIA. Repeat every 2 years.  Lung Cancer Screening: (Low Dose CT Chest recommended if Age 70-80 years, 30 pack-year currently smoking OR have quit w/in 15years.) does not qualify.     Additional Screening:  Hepatitis C Screening: Completed 03/06/2021  Vision Screening: Recommended annual ophthalmology exams for early detection of glaucoma and other disorders of the eye. Is the patient up to date with their annual eye exam?  Yes  Who is the provider or what is the name of the office in which the patient attends annual eye exams? Pt unsure of name   Dental Screening: Recommended annual dental exams for proper oral hygiene  Community Resource Referral / Chronic Care Management: CRR required this visit?  Yes for transportation & food assistance  CCM required this visit?  No      Plan:     I have personally reviewed and noted the following in the patient's chart:   Medical and social history Use of alcohol, tobacco or illicit drugs  Current medications and supplements including opioid prescriptions. Patient is not currently taking opioid prescriptions. Functional ability and status Nutritional status Physical activity Advanced directives List of other physicians Hospitalizations, surgeries, and ER visits in previous 12 months Vitals Screenings to include cognitive, depression, and falls Referrals and appointments  In addition, I have reviewed and discussed with patient certain preventive protocols, quality metrics, and best practice recommendations. A written personalized care plan for preventive services as well as general preventive health recommendations were provided to patient.   Due  to this being a telephonic visit, the after visit summary with patients personalized plan was offered to patient via mail or my-chart. Per request, patient was mailed a copy of [place], LPN   [id number]  Nurse Health Advisor  Nurse Notes: None

## 2021-07-05 ENCOUNTER — Ambulatory Visit (INDEPENDENT_AMBULATORY_CARE_PROVIDER_SITE_OTHER): Payer: Medicare HMO

## 2021-07-05 VITALS — Ht 66.0 in | Wt 157.0 lb

## 2021-07-05 DIAGNOSIS — Z1211 Encounter for screening for malignant neoplasm of colon: Secondary | ICD-10-CM | POA: Diagnosis not present

## 2021-07-05 DIAGNOSIS — Z0000 Encounter for general adult medical examination without abnormal findings: Secondary | ICD-10-CM

## 2021-07-05 NOTE — Patient Instructions (Signed)
Karen Carr , Thank you for taking time to complete your Medicare Wellness Visit. I appreciate your ongoing commitment to your health goals. Please review the following plan we discussed and let me know if I can assist you in the future.   Screening recommendations/referrals: Colonoscopy: Referral ordered today. Someone will call you to schedule.  Mammogram: Completed 05/08/2021-Due 05/08/2022 Bone Density: Completed 05/08/2021-Due 05/09/2023 Recommended yearly ophthalmology/optometry visit for glaucoma screening and checkup Recommended yearly dental visit for hygiene and checkup  Vaccinations: Influenza vaccine: Up to date Pneumococcal vaccine: Up to date Tdap vaccine: Up to date Shingles vaccine: Declined   Covid-19:Declined  Advanced directives: Information mailed today  Conditions/risks identified: See problem list  Next appointment: Follow up in one year for your annual wellness visit    Preventive Care 65 Years and Older, Female Preventive care refers to lifestyle choices and visits with your health care provider that can promote health and wellness. What does preventive care include? A yearly physical exam. This is also called an annual well check. Dental exams once or twice a year. Routine eye exams. Ask your health care provider how often you should have your eyes checked. Personal lifestyle choices, including: Daily care of your teeth and gums. Regular physical activity. Eating a healthy diet. Avoiding tobacco and drug use. Limiting alcohol use. Practicing safe sex. Taking low-dose aspirin every day. Taking vitamin and mineral supplements as recommended by your health care provider. What happens during an annual well check? The services and screenings done by your health care provider during your annual well check will depend on your age, overall health, lifestyle risk factors, and family history of disease. Counseling  Your health care provider may ask you questions  about your: Alcohol use. Tobacco use. Drug use. Emotional well-being. Home and relationship well-being. Sexual activity. Eating habits. History of falls. Memory and ability to understand (cognition). Work and work Statistician. Reproductive health. Screening  You may have the following tests or measurements: Height, weight, and BMI. Blood pressure. Lipid and cholesterol levels. These may be checked every 5 years, or more frequently if you are over 9 years old. Skin check. Lung cancer screening. You may have this screening every year starting at age 79 if you have a 30-pack-year history of smoking and currently smoke or have quit within the past 15 years. Fecal occult blood test (FOBT) of the stool. You may have this test every year starting at age 11. Flexible sigmoidoscopy or colonoscopy. You may have a sigmoidoscopy every 5 years or a colonoscopy every 10 years starting at age 28. Hepatitis C blood test. Hepatitis B blood test. Sexually transmitted disease (STD) testing. Diabetes screening. This is done by checking your blood sugar (glucose) after you have not eaten for a while (fasting). You may have this done every 1-3 years. Bone density scan. This is done to screen for osteoporosis. You may have this done starting at age 28. Mammogram. This may be done every 1-2 years. Talk to your health care provider about how often you should have regular mammograms. Talk with your health care provider about your test results, treatment options, and if necessary, the need for more tests. Vaccines  Your health care provider may recommend certain vaccines, such as: Influenza vaccine. This is recommended every year. Tetanus, diphtheria, and acellular pertussis (Tdap, Td) vaccine. You may need a Td booster every 10 years. Zoster vaccine. You may need this after age 78. Pneumococcal 13-valent conjugate (PCV13) vaccine. One dose is recommended after age 61. Pneumococcal  polysaccharide (PPSV23)  vaccine. One dose is recommended after age 74. Talk to your health care provider about which screenings and vaccines you need and how often you need them. This information is not intended to replace advice given to you by your health care provider. Make sure you discuss any questions you have with your health care provider. Document Released: 08/11/2015 Document Revised: 04/03/2016 Document Reviewed: 05/16/2015 Elsevier Interactive Patient Education  2017 [place] Prevention in the Home Falls can cause injuries. They can happen to people of all ages. There are many things you can do to make your home safe and to help prevent falls. What can I do on the outside of my home? Regularly fix the edges of walkways and driveways and fix any cracks. Remove anything that might make you trip as you walk through a door, such as a raised step or threshold. Trim any bushes or trees on the path to your home. Use bright outdoor lighting. Clear any walking paths of anything that might make someone trip, such as rocks or tools. Regularly check to see if handrails are loose or broken. Make sure that both sides of any steps have handrails. Any raised decks and porches should have guardrails on the edges. Have any leaves, snow, or ice cleared regularly. Use sand or salt on walking paths during winter. Clean up any spills in your garage right away. This includes oil or grease spills. What can I do in the bathroom? Use night lights. Install grab bars by the toilet and in the tub and shower. Do not use towel bars as grab bars. Use non-skid mats or decals in the tub or shower. If you need to sit down in the shower, use a plastic, non-slip stool. Keep the floor dry. Clean up any water that spills on the floor as soon as it happens. Remove soap buildup in the tub or shower regularly. Attach bath mats securely with double-sided non-slip rug tape. Do not have throw rugs and other things on the floor that  can make you trip. What can I do in the bedroom? Use night lights. Make sure that you have a light by your bed that is easy to reach. Do not use any sheets or blankets that are too big for your bed. They should not hang down onto the floor. Have a firm chair that has side arms. You can use this for support while you get dressed. Do not have throw rugs and other things on the floor that can make you trip. What can I do in the kitchen? Clean up any spills right away. Avoid walking on wet floors. Keep items that you use a lot in easy-to-reach places. If you need to reach something above you, use a strong step stool that has a grab bar. Keep electrical cords out of the way. Do not use floor polish or wax that makes floors slippery. If you must use wax, use non-skid floor wax. Do not have throw rugs and other things on the floor that can make you trip. What can I do with my stairs? Do not leave any items on the stairs. Make sure that there are handrails on both sides of the stairs and use them. Fix handrails that are broken or loose. Make sure that handrails are as long as the stairways. Check any carpeting to make sure that it is firmly attached to the stairs. Fix any carpet that is loose or worn. Avoid having throw rugs at the top  or bottom of the stairs. If you do have throw rugs, attach them to the floor with carpet tape. Make sure that you have a light switch at the top of the stairs and the bottom of the stairs. If you do not have them, ask someone to add them for you. What else can I do to help prevent falls? Wear shoes that: Do not have high heels. Have rubber bottoms. Are comfortable and fit you well. Are closed at the toe. Do not wear sandals. If you use a stepladder: Make sure that it is fully opened. Do not climb a closed stepladder. Make sure that both sides of the stepladder are locked into place. Ask someone to hold it for you, if possible. Clearly mark and make sure that you  can see: Any grab bars or handrails. First and last steps. Where the edge of each step is. Use tools that help you move around (mobility aids) if they are needed. These include: Canes. Walkers. Scooters. Crutches. Turn on the lights when you go into a dark area. Replace any light bulbs as soon as they burn out. Set up your furniture so you have a clear path. Avoid moving your furniture around. If any of your floors are uneven, fix them. If there are any pets around you, be aware of where they are. Review your medicines with your doctor. Some medicines can make you feel dizzy. This can increase your chance of falling. Ask your doctor what other things that you can do to help prevent falls. This information is not intended to replace advice given to you by your health care provider. Make sure you discuss any questions you have with your health care provider. Document Released: 05/11/2009 Document Revised: 12/21/2015 Document Reviewed: 08/19/2014 Elsevier Interactive Patient Education  2017 [employer].

## 2021-07-06 DIAGNOSIS — N186 End stage renal disease: Secondary | ICD-10-CM | POA: Diagnosis not present

## 2021-07-06 DIAGNOSIS — N2581 Secondary hyperparathyroidism of renal origin: Secondary | ICD-10-CM | POA: Diagnosis not present

## 2021-07-06 DIAGNOSIS — D631 Anemia in chronic kidney disease: Secondary | ICD-10-CM | POA: Diagnosis not present

## 2021-07-06 DIAGNOSIS — D509 Iron deficiency anemia, unspecified: Secondary | ICD-10-CM | POA: Diagnosis not present

## 2021-07-09 DIAGNOSIS — D509 Iron deficiency anemia, unspecified: Secondary | ICD-10-CM | POA: Diagnosis not present

## 2021-07-09 DIAGNOSIS — D631 Anemia in chronic kidney disease: Secondary | ICD-10-CM | POA: Diagnosis not present

## 2021-07-09 DIAGNOSIS — N186 End stage renal disease: Secondary | ICD-10-CM | POA: Diagnosis not present

## 2021-07-09 DIAGNOSIS — N2581 Secondary hyperparathyroidism of renal origin: Secondary | ICD-10-CM | POA: Diagnosis not present

## 2021-07-11 DIAGNOSIS — D631 Anemia in chronic kidney disease: Secondary | ICD-10-CM | POA: Diagnosis not present

## 2021-07-11 DIAGNOSIS — N186 End stage renal disease: Secondary | ICD-10-CM | POA: Diagnosis not present

## 2021-07-11 DIAGNOSIS — D509 Iron deficiency anemia, unspecified: Secondary | ICD-10-CM | POA: Diagnosis not present

## 2021-07-11 DIAGNOSIS — N2581 Secondary hyperparathyroidism of renal origin: Secondary | ICD-10-CM | POA: Diagnosis not present

## 2021-07-13 DIAGNOSIS — D509 Iron deficiency anemia, unspecified: Secondary | ICD-10-CM | POA: Diagnosis not present

## 2021-07-13 DIAGNOSIS — N186 End stage renal disease: Secondary | ICD-10-CM | POA: Diagnosis not present

## 2021-07-13 DIAGNOSIS — N2581 Secondary hyperparathyroidism of renal origin: Secondary | ICD-10-CM | POA: Diagnosis not present

## 2021-07-13 DIAGNOSIS — D631 Anemia in chronic kidney disease: Secondary | ICD-10-CM | POA: Diagnosis not present

## 2021-07-14 ENCOUNTER — Other Ambulatory Visit: Payer: Self-pay | Admitting: Family Medicine

## 2021-07-14 DIAGNOSIS — M329 Systemic lupus erythematosus, unspecified: Secondary | ICD-10-CM

## 2021-07-20 DIAGNOSIS — N2581 Secondary hyperparathyroidism of renal origin: Secondary | ICD-10-CM | POA: Diagnosis not present

## 2021-07-20 DIAGNOSIS — D509 Iron deficiency anemia, unspecified: Secondary | ICD-10-CM | POA: Diagnosis not present

## 2021-07-20 DIAGNOSIS — N186 End stage renal disease: Secondary | ICD-10-CM | POA: Diagnosis not present

## 2021-07-20 DIAGNOSIS — D631 Anemia in chronic kidney disease: Secondary | ICD-10-CM | POA: Diagnosis not present

## 2021-07-24 ENCOUNTER — Telehealth: Payer: Self-pay

## 2021-07-24 NOTE — Telephone Encounter (Signed)
Nurse Assessment Nurse: Zenia Resides, RN, Diane Date/Time Eilene Ghazi Time): 07/23/2021 10:03:08 AM Confirm and document reason for call. If symptomatic, describe symptoms. ---Caller states she has chest congestion. Hard to breathe when laying down or with exertion. Doesn't know if she has a fever. Also has some chest pressure. Symptoms started yesterday. Does the patient have any new or worsening symptoms? ---Yes Will a triage be completed? ---Yes Related visit to physician within the last 2 weeks? ---No Does the PT have any chronic conditions? (i.e. diabetes, asthma, this includes High risk factors for pregnancy, etc.) ---Yes List chronic conditions. ---HTN, HD pt. Is this a behavioral health or substance abuse call? ---No Guidelines Guideline Title Affirmed Question Affirmed Notes Nurse Date/Time (Eastern Time) COVID-19 - Diagnosed or Suspected Chest pain or pressure (Exception: MILD central chest pain, present only when coughing) Zenia Resides, RN, Diane 07/23/2021 10:05:00 AM PLEASE NOTE: All timestamps contained within this report are represented as [place] Standard Time. CONFIDENTIALTY NOTICE: This fax transmission is intended only for the addressee. It contains information that is legally privileged, confidential or otherwise protected from use or disclosure. If you are not the intended recipient, you are strictly prohibited from reviewing, disclosing, copying using or disseminating any of this information or taking any action in reliance on or regarding this information. If you have received this fax in error, please notify us immediately by telephone so that we can arrange for its return to [place]. Phone: [phone], Toll-Free: [phone], Fax: [phone] Page: 2 of 2 Call Id: [id number] [place]. Time Eilene Ghazi Time) Disposition Final User 07/23/2021 10:02:09 AM Send to Urgent Queue Olga Coaster 07/23/2021 10:07:15 AM Go to ED Now (or PCP triage) Yes Zenia Resides, RN, Diane Caller  Disagree/Comply Comply Caller Understands Yes PreDisposition Call Doctor Care Advice Given Per Guideline GO TO ED NOW (OR PCP TRIAGE): * IF NO PCP (PRIMARY CARE PROVIDER) SECOND-LEVEL TRIAGE: You need to be seen within the next hour. Go to the [facility] at _____________ [place] as soon as you can. CARE ADVICE given per COVID-19 - DIAGNOSED OR SUSPECTED (Adult) guideline. Referrals [facility] [place]

## 2021-07-24 NOTE — Telephone Encounter (Signed)
Pt called and lvm tp return call to schedule an appt

## 2021-07-25 NOTE — Telephone Encounter (Signed)
Called the patient Left detailed message to call the office and schedule appointment with PCP.

## 2021-07-26 DIAGNOSIS — E872 Acidosis, unspecified: Secondary | ICD-10-CM | POA: Diagnosis not present

## 2021-07-26 DIAGNOSIS — N186 End stage renal disease: Secondary | ICD-10-CM | POA: Diagnosis not present

## 2021-07-26 DIAGNOSIS — R531 Weakness: Secondary | ICD-10-CM | POA: Diagnosis not present

## 2021-07-26 DIAGNOSIS — Z8616 Personal history of COVID-19: Secondary | ICD-10-CM | POA: Diagnosis not present

## 2021-07-26 DIAGNOSIS — E875 Hyperkalemia: Secondary | ICD-10-CM | POA: Diagnosis not present

## 2021-07-26 DIAGNOSIS — Z79899 Other long term (current) drug therapy: Secondary | ICD-10-CM | POA: Diagnosis not present

## 2021-07-26 DIAGNOSIS — I120 Hypertensive chronic kidney disease with stage 5 chronic kidney disease or end stage renal disease: Secondary | ICD-10-CM | POA: Diagnosis not present

## 2021-07-26 DIAGNOSIS — U071 COVID-19: Secondary | ICD-10-CM | POA: Diagnosis not present

## 2021-07-26 DIAGNOSIS — M3214 Glomerular disease in systemic lupus erythematosus: Secondary | ICD-10-CM | POA: Diagnosis not present

## 2021-07-26 DIAGNOSIS — J90 Pleural effusion, not elsewhere classified: Secondary | ICD-10-CM | POA: Diagnosis not present

## 2021-07-26 DIAGNOSIS — R001 Bradycardia, unspecified: Secondary | ICD-10-CM | POA: Diagnosis not present

## 2021-07-26 DIAGNOSIS — I517 Cardiomegaly: Secondary | ICD-10-CM | POA: Diagnosis not present

## 2021-07-26 DIAGNOSIS — R7989 Other specified abnormal findings of blood chemistry: Secondary | ICD-10-CM | POA: Diagnosis not present

## 2021-07-26 DIAGNOSIS — Z992 Dependence on renal dialysis: Secondary | ICD-10-CM | POA: Diagnosis not present

## 2021-07-27 DIAGNOSIS — R001 Bradycardia, unspecified: Secondary | ICD-10-CM | POA: Diagnosis not present

## 2021-07-28 DIAGNOSIS — N186 End stage renal disease: Secondary | ICD-10-CM | POA: Diagnosis not present

## 2021-07-28 DIAGNOSIS — Z992 Dependence on renal dialysis: Secondary | ICD-10-CM | POA: Diagnosis not present

## 2021-07-30 DIAGNOSIS — N186 End stage renal disease: Secondary | ICD-10-CM | POA: Diagnosis not present

## 2021-07-30 DIAGNOSIS — D631 Anemia in chronic kidney disease: Secondary | ICD-10-CM | POA: Diagnosis not present

## 2021-07-30 DIAGNOSIS — D509 Iron deficiency anemia, unspecified: Secondary | ICD-10-CM | POA: Diagnosis not present

## 2021-07-30 DIAGNOSIS — I10 Essential (primary) hypertension: Secondary | ICD-10-CM | POA: Diagnosis not present

## 2021-07-30 DIAGNOSIS — N2581 Secondary hyperparathyroidism of renal origin: Secondary | ICD-10-CM | POA: Diagnosis not present

## 2021-08-01 DIAGNOSIS — N2581 Secondary hyperparathyroidism of renal origin: Secondary | ICD-10-CM | POA: Diagnosis not present

## 2021-08-01 DIAGNOSIS — N186 End stage renal disease: Secondary | ICD-10-CM | POA: Diagnosis not present

## 2021-08-01 DIAGNOSIS — D509 Iron deficiency anemia, unspecified: Secondary | ICD-10-CM | POA: Diagnosis not present

## 2021-08-01 DIAGNOSIS — D631 Anemia in chronic kidney disease: Secondary | ICD-10-CM | POA: Diagnosis not present

## 2021-08-01 DIAGNOSIS — I10 Essential (primary) hypertension: Secondary | ICD-10-CM | POA: Diagnosis not present

## 2021-08-03 DIAGNOSIS — I10 Essential (primary) hypertension: Secondary | ICD-10-CM | POA: Diagnosis not present

## 2021-08-03 DIAGNOSIS — D509 Iron deficiency anemia, unspecified: Secondary | ICD-10-CM | POA: Diagnosis not present

## 2021-08-03 DIAGNOSIS — N186 End stage renal disease: Secondary | ICD-10-CM | POA: Diagnosis not present

## 2021-08-03 DIAGNOSIS — D631 Anemia in chronic kidney disease: Secondary | ICD-10-CM | POA: Diagnosis not present

## 2021-08-03 DIAGNOSIS — N2581 Secondary hyperparathyroidism of renal origin: Secondary | ICD-10-CM | POA: Diagnosis not present

## 2021-08-07 DIAGNOSIS — I10 Essential (primary) hypertension: Secondary | ICD-10-CM | POA: Diagnosis not present

## 2021-08-07 DIAGNOSIS — D631 Anemia in chronic kidney disease: Secondary | ICD-10-CM | POA: Diagnosis not present

## 2021-08-07 DIAGNOSIS — D509 Iron deficiency anemia, unspecified: Secondary | ICD-10-CM | POA: Diagnosis not present

## 2021-08-07 DIAGNOSIS — N2581 Secondary hyperparathyroidism of renal origin: Secondary | ICD-10-CM | POA: Diagnosis not present

## 2021-08-07 DIAGNOSIS — N186 End stage renal disease: Secondary | ICD-10-CM | POA: Diagnosis not present

## 2021-08-09 ENCOUNTER — Telehealth: Payer: Self-pay | Admitting: *Deleted

## 2021-08-09 DIAGNOSIS — N186 End stage renal disease: Secondary | ICD-10-CM | POA: Diagnosis not present

## 2021-08-09 DIAGNOSIS — D509 Iron deficiency anemia, unspecified: Secondary | ICD-10-CM | POA: Diagnosis not present

## 2021-08-09 DIAGNOSIS — Z1159 Encounter for screening for other viral diseases: Secondary | ICD-10-CM | POA: Diagnosis not present

## 2021-08-09 DIAGNOSIS — N2581 Secondary hyperparathyroidism of renal origin: Secondary | ICD-10-CM | POA: Diagnosis not present

## 2021-08-09 DIAGNOSIS — D631 Anemia in chronic kidney disease: Secondary | ICD-10-CM | POA: Diagnosis not present

## 2021-08-09 DIAGNOSIS — I10 Essential (primary) hypertension: Secondary | ICD-10-CM | POA: Diagnosis not present

## 2021-08-09 NOTE — Telephone Encounter (Signed)
° °  Telephone encounter was:  Unsuccessful.  08/09/2021 Name: Kameryn Davern MRN: [id number] DOB: 1956-03-20  Unsuccessful outbound call made today to assist with:  Food Insecurity  Outreach Attempt:  1st Attempt  A HIPAA compliant voice message was left requesting a return call.  Instructed patient to call back at   Instructed patient to call back at [phone]  at their earliest convenience. . [facility], Care Management  [phone] [address] , [place] [address] Email : [email or web address]

## 2021-08-10 ENCOUNTER — Telehealth: Payer: Self-pay | Admitting: *Deleted

## 2021-08-10 NOTE — Telephone Encounter (Signed)
° °  Telephone encounter was:  Unsuccessful.  08/10/2021 Name: Karen Carr MRN: [id number] DOB: 07-26-1956  Unsuccessful outbound call made today to assist with:  Food Insecurity  Outreach Attempt:  2nd Attempt  Call cannot be completed at this time  [place], Care Management  [phone] [address] , [place] [address] Email : [email or web address]

## 2021-08-11 DIAGNOSIS — N2581 Secondary hyperparathyroidism of renal origin: Secondary | ICD-10-CM | POA: Diagnosis not present

## 2021-08-11 DIAGNOSIS — I10 Essential (primary) hypertension: Secondary | ICD-10-CM | POA: Diagnosis not present

## 2021-08-11 DIAGNOSIS — D631 Anemia in chronic kidney disease: Secondary | ICD-10-CM | POA: Diagnosis not present

## 2021-08-11 DIAGNOSIS — N186 End stage renal disease: Secondary | ICD-10-CM | POA: Diagnosis not present

## 2021-08-11 DIAGNOSIS — D509 Iron deficiency anemia, unspecified: Secondary | ICD-10-CM | POA: Diagnosis not present

## 2021-08-13 ENCOUNTER — Telehealth: Payer: Self-pay | Admitting: *Deleted

## 2021-08-13 NOTE — Telephone Encounter (Signed)
° °  Telephone encounter was:  Unsuccessful.  08/13/2021 Name: Karen Carr MRN: [id number] DOB: April 19, 1956  Unsuccessful outbound call made today to assist with:  Food Insecurity  Outreach Attempt:  3rd Attempt.  Referral closed unable to contact patient.  Cannot complete call at this time   [facility], Care Management  [phone] [address] , [place] [address] Email : [email or web address]

## 2021-08-16 DIAGNOSIS — I10 Essential (primary) hypertension: Secondary | ICD-10-CM | POA: Diagnosis not present

## 2021-08-16 DIAGNOSIS — N186 End stage renal disease: Secondary | ICD-10-CM | POA: Diagnosis not present

## 2021-08-16 DIAGNOSIS — D631 Anemia in chronic kidney disease: Secondary | ICD-10-CM | POA: Diagnosis not present

## 2021-08-16 DIAGNOSIS — D509 Iron deficiency anemia, unspecified: Secondary | ICD-10-CM | POA: Diagnosis not present

## 2021-08-16 DIAGNOSIS — N2581 Secondary hyperparathyroidism of renal origin: Secondary | ICD-10-CM | POA: Diagnosis not present

## 2021-08-21 DIAGNOSIS — N186 End stage renal disease: Secondary | ICD-10-CM | POA: Diagnosis not present

## 2021-08-21 DIAGNOSIS — I10 Essential (primary) hypertension: Secondary | ICD-10-CM | POA: Diagnosis not present

## 2021-08-21 DIAGNOSIS — D631 Anemia in chronic kidney disease: Secondary | ICD-10-CM | POA: Diagnosis not present

## 2021-08-21 DIAGNOSIS — N2581 Secondary hyperparathyroidism of renal origin: Secondary | ICD-10-CM | POA: Diagnosis not present

## 2021-08-21 DIAGNOSIS — D509 Iron deficiency anemia, unspecified: Secondary | ICD-10-CM | POA: Diagnosis not present

## 2021-08-23 ENCOUNTER — Other Ambulatory Visit: Payer: Self-pay | Admitting: Family Medicine

## 2021-08-23 DIAGNOSIS — M329 Systemic lupus erythematosus, unspecified: Secondary | ICD-10-CM

## 2021-08-25 DIAGNOSIS — D631 Anemia in chronic kidney disease: Secondary | ICD-10-CM | POA: Diagnosis not present

## 2021-08-25 DIAGNOSIS — I10 Essential (primary) hypertension: Secondary | ICD-10-CM | POA: Diagnosis not present

## 2021-08-25 DIAGNOSIS — N186 End stage renal disease: Secondary | ICD-10-CM | POA: Diagnosis not present

## 2021-08-25 DIAGNOSIS — D509 Iron deficiency anemia, unspecified: Secondary | ICD-10-CM | POA: Diagnosis not present

## 2021-08-25 DIAGNOSIS — N2581 Secondary hyperparathyroidism of renal origin: Secondary | ICD-10-CM | POA: Diagnosis not present

## 2021-08-27 ENCOUNTER — Other Ambulatory Visit: Payer: Self-pay | Admitting: Family Medicine

## 2021-08-27 DIAGNOSIS — M329 Systemic lupus erythematosus, unspecified: Secondary | ICD-10-CM

## 2021-08-28 DIAGNOSIS — I10 Essential (primary) hypertension: Secondary | ICD-10-CM | POA: Diagnosis not present

## 2021-08-28 DIAGNOSIS — N186 End stage renal disease: Secondary | ICD-10-CM | POA: Diagnosis not present

## 2021-08-28 DIAGNOSIS — N2581 Secondary hyperparathyroidism of renal origin: Secondary | ICD-10-CM | POA: Diagnosis not present

## 2021-08-28 DIAGNOSIS — D509 Iron deficiency anemia, unspecified: Secondary | ICD-10-CM | POA: Diagnosis not present

## 2021-08-28 DIAGNOSIS — D631 Anemia in chronic kidney disease: Secondary | ICD-10-CM | POA: Diagnosis not present

## 2021-08-28 DIAGNOSIS — Z992 Dependence on renal dialysis: Secondary | ICD-10-CM | POA: Diagnosis not present

## 2021-08-30 DIAGNOSIS — N186 End stage renal disease: Secondary | ICD-10-CM | POA: Diagnosis not present

## 2021-08-30 DIAGNOSIS — D631 Anemia in chronic kidney disease: Secondary | ICD-10-CM | POA: Diagnosis not present

## 2021-08-30 DIAGNOSIS — N2581 Secondary hyperparathyroidism of renal origin: Secondary | ICD-10-CM | POA: Diagnosis not present

## 2021-08-30 DIAGNOSIS — D509 Iron deficiency anemia, unspecified: Secondary | ICD-10-CM | POA: Diagnosis not present

## 2021-09-01 DIAGNOSIS — N2581 Secondary hyperparathyroidism of renal origin: Secondary | ICD-10-CM | POA: Diagnosis not present

## 2021-09-01 DIAGNOSIS — D631 Anemia in chronic kidney disease: Secondary | ICD-10-CM | POA: Diagnosis not present

## 2021-09-01 DIAGNOSIS — D509 Iron deficiency anemia, unspecified: Secondary | ICD-10-CM | POA: Diagnosis not present

## 2021-09-01 DIAGNOSIS — N186 End stage renal disease: Secondary | ICD-10-CM | POA: Diagnosis not present

## 2021-09-08 DIAGNOSIS — D631 Anemia in chronic kidney disease: Secondary | ICD-10-CM | POA: Diagnosis not present

## 2021-09-08 DIAGNOSIS — N2581 Secondary hyperparathyroidism of renal origin: Secondary | ICD-10-CM | POA: Diagnosis not present

## 2021-09-08 DIAGNOSIS — N186 End stage renal disease: Secondary | ICD-10-CM | POA: Diagnosis not present

## 2021-09-08 DIAGNOSIS — D509 Iron deficiency anemia, unspecified: Secondary | ICD-10-CM | POA: Diagnosis not present

## 2021-09-10 ENCOUNTER — Ambulatory Visit: Payer: Medicare HMO | Admitting: Family Medicine

## 2021-09-11 DIAGNOSIS — N2581 Secondary hyperparathyroidism of renal origin: Secondary | ICD-10-CM | POA: Diagnosis not present

## 2021-09-11 DIAGNOSIS — N186 End stage renal disease: Secondary | ICD-10-CM | POA: Diagnosis not present

## 2021-09-11 DIAGNOSIS — D509 Iron deficiency anemia, unspecified: Secondary | ICD-10-CM | POA: Diagnosis not present

## 2021-09-11 DIAGNOSIS — D631 Anemia in chronic kidney disease: Secondary | ICD-10-CM | POA: Diagnosis not present

## 2021-09-13 DIAGNOSIS — D631 Anemia in chronic kidney disease: Secondary | ICD-10-CM | POA: Diagnosis not present

## 2021-09-13 DIAGNOSIS — D509 Iron deficiency anemia, unspecified: Secondary | ICD-10-CM | POA: Diagnosis not present

## 2021-09-13 DIAGNOSIS — N2581 Secondary hyperparathyroidism of renal origin: Secondary | ICD-10-CM | POA: Diagnosis not present

## 2021-09-13 DIAGNOSIS — N186 End stage renal disease: Secondary | ICD-10-CM | POA: Diagnosis not present

## 2021-09-15 DIAGNOSIS — N2581 Secondary hyperparathyroidism of renal origin: Secondary | ICD-10-CM | POA: Diagnosis not present

## 2021-09-15 DIAGNOSIS — N186 End stage renal disease: Secondary | ICD-10-CM | POA: Diagnosis not present

## 2021-09-15 DIAGNOSIS — D509 Iron deficiency anemia, unspecified: Secondary | ICD-10-CM | POA: Diagnosis not present

## 2021-09-15 DIAGNOSIS — D631 Anemia in chronic kidney disease: Secondary | ICD-10-CM | POA: Diagnosis not present

## 2021-09-18 DIAGNOSIS — D631 Anemia in chronic kidney disease: Secondary | ICD-10-CM | POA: Diagnosis not present

## 2021-09-18 DIAGNOSIS — D509 Iron deficiency anemia, unspecified: Secondary | ICD-10-CM | POA: Diagnosis not present

## 2021-09-18 DIAGNOSIS — N186 End stage renal disease: Secondary | ICD-10-CM | POA: Diagnosis not present

## 2021-09-18 DIAGNOSIS — N2581 Secondary hyperparathyroidism of renal origin: Secondary | ICD-10-CM | POA: Diagnosis not present

## 2021-09-22 DIAGNOSIS — D509 Iron deficiency anemia, unspecified: Secondary | ICD-10-CM | POA: Diagnosis not present

## 2021-09-22 DIAGNOSIS — N186 End stage renal disease: Secondary | ICD-10-CM | POA: Diagnosis not present

## 2021-09-22 DIAGNOSIS — N2581 Secondary hyperparathyroidism of renal origin: Secondary | ICD-10-CM | POA: Diagnosis not present

## 2021-09-22 DIAGNOSIS — D631 Anemia in chronic kidney disease: Secondary | ICD-10-CM | POA: Diagnosis not present

## 2021-09-25 DIAGNOSIS — D631 Anemia in chronic kidney disease: Secondary | ICD-10-CM | POA: Diagnosis not present

## 2021-09-25 DIAGNOSIS — N186 End stage renal disease: Secondary | ICD-10-CM | POA: Diagnosis not present

## 2021-09-25 DIAGNOSIS — Z992 Dependence on renal dialysis: Secondary | ICD-10-CM | POA: Diagnosis not present

## 2021-09-25 DIAGNOSIS — D509 Iron deficiency anemia, unspecified: Secondary | ICD-10-CM | POA: Diagnosis not present

## 2021-09-25 DIAGNOSIS — N2581 Secondary hyperparathyroidism of renal origin: Secondary | ICD-10-CM | POA: Diagnosis not present

## 2021-09-29 DIAGNOSIS — D631 Anemia in chronic kidney disease: Secondary | ICD-10-CM | POA: Diagnosis not present

## 2021-09-29 DIAGNOSIS — N2581 Secondary hyperparathyroidism of renal origin: Secondary | ICD-10-CM | POA: Diagnosis not present

## 2021-09-29 DIAGNOSIS — D509 Iron deficiency anemia, unspecified: Secondary | ICD-10-CM | POA: Diagnosis not present

## 2021-09-29 DIAGNOSIS — N186 End stage renal disease: Secondary | ICD-10-CM | POA: Diagnosis not present

## 2021-10-02 DIAGNOSIS — N186 End stage renal disease: Secondary | ICD-10-CM | POA: Diagnosis not present

## 2021-10-02 DIAGNOSIS — D631 Anemia in chronic kidney disease: Secondary | ICD-10-CM | POA: Diagnosis not present

## 2021-10-02 DIAGNOSIS — N2581 Secondary hyperparathyroidism of renal origin: Secondary | ICD-10-CM | POA: Diagnosis not present

## 2021-10-02 DIAGNOSIS — D509 Iron deficiency anemia, unspecified: Secondary | ICD-10-CM | POA: Diagnosis not present

## 2021-10-04 DIAGNOSIS — N2581 Secondary hyperparathyroidism of renal origin: Secondary | ICD-10-CM | POA: Diagnosis not present

## 2021-10-04 DIAGNOSIS — N186 End stage renal disease: Secondary | ICD-10-CM | POA: Diagnosis not present

## 2021-10-04 DIAGNOSIS — D509 Iron deficiency anemia, unspecified: Secondary | ICD-10-CM | POA: Diagnosis not present

## 2021-10-04 DIAGNOSIS — D631 Anemia in chronic kidney disease: Secondary | ICD-10-CM | POA: Diagnosis not present

## 2021-10-06 DIAGNOSIS — N186 End stage renal disease: Secondary | ICD-10-CM | POA: Diagnosis not present

## 2021-10-06 DIAGNOSIS — N2581 Secondary hyperparathyroidism of renal origin: Secondary | ICD-10-CM | POA: Diagnosis not present

## 2021-10-06 DIAGNOSIS — D631 Anemia in chronic kidney disease: Secondary | ICD-10-CM | POA: Diagnosis not present

## 2021-10-06 DIAGNOSIS — D509 Iron deficiency anemia, unspecified: Secondary | ICD-10-CM | POA: Diagnosis not present

## 2021-10-11 DIAGNOSIS — N186 End stage renal disease: Secondary | ICD-10-CM | POA: Diagnosis not present

## 2021-10-11 DIAGNOSIS — D509 Iron deficiency anemia, unspecified: Secondary | ICD-10-CM | POA: Diagnosis not present

## 2021-10-11 DIAGNOSIS — N2581 Secondary hyperparathyroidism of renal origin: Secondary | ICD-10-CM | POA: Diagnosis not present

## 2021-10-11 DIAGNOSIS — D631 Anemia in chronic kidney disease: Secondary | ICD-10-CM | POA: Diagnosis not present

## 2021-10-13 DIAGNOSIS — N2581 Secondary hyperparathyroidism of renal origin: Secondary | ICD-10-CM | POA: Diagnosis not present

## 2021-10-13 DIAGNOSIS — N186 End stage renal disease: Secondary | ICD-10-CM | POA: Diagnosis not present

## 2021-10-13 DIAGNOSIS — D509 Iron deficiency anemia, unspecified: Secondary | ICD-10-CM | POA: Diagnosis not present

## 2021-10-13 DIAGNOSIS — D631 Anemia in chronic kidney disease: Secondary | ICD-10-CM | POA: Diagnosis not present

## 2021-10-16 DIAGNOSIS — D509 Iron deficiency anemia, unspecified: Secondary | ICD-10-CM | POA: Diagnosis not present

## 2021-10-16 DIAGNOSIS — D631 Anemia in chronic kidney disease: Secondary | ICD-10-CM | POA: Diagnosis not present

## 2021-10-16 DIAGNOSIS — N2581 Secondary hyperparathyroidism of renal origin: Secondary | ICD-10-CM | POA: Diagnosis not present

## 2021-10-16 DIAGNOSIS — N186 End stage renal disease: Secondary | ICD-10-CM | POA: Diagnosis not present

## 2021-10-18 DIAGNOSIS — D631 Anemia in chronic kidney disease: Secondary | ICD-10-CM | POA: Diagnosis not present

## 2021-10-18 DIAGNOSIS — N186 End stage renal disease: Secondary | ICD-10-CM | POA: Diagnosis not present

## 2021-10-18 DIAGNOSIS — N2581 Secondary hyperparathyroidism of renal origin: Secondary | ICD-10-CM | POA: Diagnosis not present

## 2021-10-18 DIAGNOSIS — D509 Iron deficiency anemia, unspecified: Secondary | ICD-10-CM | POA: Diagnosis not present

## 2021-10-23 DIAGNOSIS — D509 Iron deficiency anemia, unspecified: Secondary | ICD-10-CM | POA: Diagnosis not present

## 2021-10-23 DIAGNOSIS — N186 End stage renal disease: Secondary | ICD-10-CM | POA: Diagnosis not present

## 2021-10-23 DIAGNOSIS — D631 Anemia in chronic kidney disease: Secondary | ICD-10-CM | POA: Diagnosis not present

## 2021-10-23 DIAGNOSIS — N2581 Secondary hyperparathyroidism of renal origin: Secondary | ICD-10-CM | POA: Diagnosis not present

## 2021-10-24 ENCOUNTER — Other Ambulatory Visit: Payer: Self-pay | Admitting: Family Medicine

## 2021-10-24 DIAGNOSIS — F418 Other specified anxiety disorders: Secondary | ICD-10-CM

## 2021-10-24 DIAGNOSIS — M329 Systemic lupus erythematosus, unspecified: Secondary | ICD-10-CM

## 2021-10-25 DIAGNOSIS — N186 End stage renal disease: Secondary | ICD-10-CM | POA: Diagnosis not present

## 2021-10-25 DIAGNOSIS — D631 Anemia in chronic kidney disease: Secondary | ICD-10-CM | POA: Diagnosis not present

## 2021-10-25 DIAGNOSIS — N2581 Secondary hyperparathyroidism of renal origin: Secondary | ICD-10-CM | POA: Diagnosis not present

## 2021-10-25 DIAGNOSIS — D509 Iron deficiency anemia, unspecified: Secondary | ICD-10-CM | POA: Diagnosis not present

## 2021-10-26 DIAGNOSIS — N186 End stage renal disease: Secondary | ICD-10-CM | POA: Diagnosis not present

## 2021-10-26 DIAGNOSIS — Z992 Dependence on renal dialysis: Secondary | ICD-10-CM | POA: Diagnosis not present

## 2021-10-30 DIAGNOSIS — D509 Iron deficiency anemia, unspecified: Secondary | ICD-10-CM | POA: Diagnosis not present

## 2021-10-30 DIAGNOSIS — N2581 Secondary hyperparathyroidism of renal origin: Secondary | ICD-10-CM | POA: Diagnosis not present

## 2021-10-30 DIAGNOSIS — N186 End stage renal disease: Secondary | ICD-10-CM | POA: Diagnosis not present

## 2021-11-01 DIAGNOSIS — D509 Iron deficiency anemia, unspecified: Secondary | ICD-10-CM | POA: Diagnosis not present

## 2021-11-01 DIAGNOSIS — N2581 Secondary hyperparathyroidism of renal origin: Secondary | ICD-10-CM | POA: Diagnosis not present

## 2021-11-01 DIAGNOSIS — N186 End stage renal disease: Secondary | ICD-10-CM | POA: Diagnosis not present

## 2021-11-06 ENCOUNTER — Other Ambulatory Visit: Payer: Self-pay | Admitting: Family Medicine

## 2021-11-08 DIAGNOSIS — N186 End stage renal disease: Secondary | ICD-10-CM | POA: Diagnosis not present

## 2021-11-08 DIAGNOSIS — N2581 Secondary hyperparathyroidism of renal origin: Secondary | ICD-10-CM | POA: Diagnosis not present

## 2021-11-08 DIAGNOSIS — D509 Iron deficiency anemia, unspecified: Secondary | ICD-10-CM | POA: Diagnosis not present

## 2021-11-10 DIAGNOSIS — N186 End stage renal disease: Secondary | ICD-10-CM | POA: Diagnosis not present

## 2021-11-10 DIAGNOSIS — N2581 Secondary hyperparathyroidism of renal origin: Secondary | ICD-10-CM | POA: Diagnosis not present

## 2021-11-10 DIAGNOSIS — D509 Iron deficiency anemia, unspecified: Secondary | ICD-10-CM | POA: Diagnosis not present

## 2021-11-13 DIAGNOSIS — D509 Iron deficiency anemia, unspecified: Secondary | ICD-10-CM | POA: Diagnosis not present

## 2021-11-13 DIAGNOSIS — N2581 Secondary hyperparathyroidism of renal origin: Secondary | ICD-10-CM | POA: Diagnosis not present

## 2021-11-13 DIAGNOSIS — N186 End stage renal disease: Secondary | ICD-10-CM | POA: Diagnosis not present

## 2021-11-15 DIAGNOSIS — N186 End stage renal disease: Secondary | ICD-10-CM | POA: Diagnosis not present

## 2021-11-15 DIAGNOSIS — N2581 Secondary hyperparathyroidism of renal origin: Secondary | ICD-10-CM | POA: Diagnosis not present

## 2021-11-15 DIAGNOSIS — D509 Iron deficiency anemia, unspecified: Secondary | ICD-10-CM | POA: Diagnosis not present

## 2021-11-17 DIAGNOSIS — D509 Iron deficiency anemia, unspecified: Secondary | ICD-10-CM | POA: Diagnosis not present

## 2021-11-17 DIAGNOSIS — N186 End stage renal disease: Secondary | ICD-10-CM | POA: Diagnosis not present

## 2021-11-17 DIAGNOSIS — N2581 Secondary hyperparathyroidism of renal origin: Secondary | ICD-10-CM | POA: Diagnosis not present

## 2021-11-19 ENCOUNTER — Ambulatory Visit (INDEPENDENT_AMBULATORY_CARE_PROVIDER_SITE_OTHER): Payer: Medicare HMO | Admitting: Family Medicine

## 2021-11-19 ENCOUNTER — Encounter: Payer: Self-pay | Admitting: Family Medicine

## 2021-11-19 VITALS — BP 132/62 | HR 67 | Temp 98.0°F | Ht 66.0 in | Wt 162.2 lb

## 2021-11-19 DIAGNOSIS — M545 Low back pain, unspecified: Secondary | ICD-10-CM | POA: Diagnosis not present

## 2021-11-19 DIAGNOSIS — J302 Other seasonal allergic rhinitis: Secondary | ICD-10-CM

## 2021-11-19 DIAGNOSIS — L84 Corns and callosities: Secondary | ICD-10-CM | POA: Diagnosis not present

## 2021-11-19 DIAGNOSIS — G8929 Other chronic pain: Secondary | ICD-10-CM

## 2021-11-19 DIAGNOSIS — Z1211 Encounter for screening for malignant neoplasm of colon: Secondary | ICD-10-CM

## 2021-11-19 DIAGNOSIS — R899 Unspecified abnormal finding in specimens from other organs, systems and tissues: Secondary | ICD-10-CM | POA: Diagnosis not present

## 2021-11-19 LAB — CBC
HCT: 30.5 % — ABNORMAL LOW (ref 36.0–46.0)
Hemoglobin: 9.8 g/dL — ABNORMAL LOW (ref 12.0–15.0)
MCHC: 32.2 g/dL (ref 30.0–36.0)
MCV: 97.7 fl (ref 78.0–100.0)
Platelets: 105 10*3/uL — ABNORMAL LOW (ref 150.0–400.0)
RBC: 3.12 Mil/uL — ABNORMAL LOW (ref 3.87–5.11)
RDW: 14.7 % (ref 11.5–15.5)
WBC: 4.3 10*3/uL (ref 4.0–10.5)

## 2021-11-19 MED ORDER — LEVOCETIRIZINE DIHYDROCHLORIDE 5 MG PO TABS: 5.0000 mg | ORAL_TABLET | Freq: Every evening | ORAL | 2 refills | Status: AC

## 2021-11-19 MED ORDER — FLUTICASONE PROPIONATE 50 MCG/ACT NA SUSP: 2.0000 | Freq: Every day | NASAL | 6 refills | Status: AC

## 2021-11-19 NOTE — Patient Instructions (Addendum)
Ice/cold pack over area for 10-15 min twice daily. ? ?Heat (pad or rice pillow in microwave) over affected area, 10-15 minutes twice daily.  ? ?OK to take Tylenol 1000 mg (2 extra strength tabs) or 975 mg (3 regular strength tabs) every 6 hours as needed. ? ?Give [place] 2-3 business days to get the results of your labs back.  ? ?If you do not hear anything about your referrals in the next 1-2 weeks, call our office and ask for an update. ? ?Let us know if you need anything. ?

## 2021-11-19 NOTE — Progress Notes (Signed)
Musculoskeletal Exam ? ?Patient: Karen Carr DOB: [birth year] ? ?DOS: 11/19/2021 ? ?SUBJECTIVE: ? ?Chief Complaint:  ? ?Chief Complaint  ?Patient presents with  ? Leg Pain  ? Foot Pain  ? Allergies  ? ? ?Karen Carr is a 66 y.o.  female for evaluation and treatment of b/l foot pain.  ? ?Onset:  Several mo, worsening in past 1 mo or so. No inj or change in activity.  ?Location: bottom of both feet ?Character:  aching and sharp  ?Progression of issue:  has worsened ?Associated symptoms: Callus formation on feet; associated pain in L thigh and low back ?No bruising, swelling, redness ?Treatment: to date has been rest.   ?Neurovascular symptoms: no ? ?Itchy and watery eyes over the past 2 weeks.  She has associated bags under her eyes and nasal congestion.  She has sinus pressure above her eyes.  No ear pain/drainage, fevers, sore throat, dental pain, coughing, or shortness of breath.  She has not taken anything at home. ? ?Patient follows with the nephrology team for end-stage renal disease on dialysis.  She reports on her last dialysis labs, she was told she has an infection.  She denies any ear pain, sore throat, coughing, congestion other than described above, urinary complaints, rashes, or diarrhea. ? ?Past Medical History:  ?Diagnosis Date  ? ESRD (end stage renal disease) ([facility])   ? Gout   ? Hyperlipidemia   ? Hypertension   ? Major depressive disorder   ? SLE (systemic lupus erythematosus) ([place])   ? Vitamin D deficiency   ? ? ?Objective: ?VITAL SIGNS: BP 132/62   Pulse 67   Temp 98 ?F (36.7 ?C) (Oral)   Ht '5\' 6"'$  (1.676 m)   Wt 162 lb 4 oz (73.6 kg)   SpO2 97%   BMI 26.19 kg/m?  ?Constitutional: Well formed, well developed. No acute distress. ?HEENT: Nares are patent without rhinorrhea, ears with scant wax, TMs negative bilaterally, MMM, no pharyngeal exudate or erythema ?Thorax & Lungs: Clear to auscultation bilaterally.  No accessory muscle use ?Cardiac: RRR ?Musculoskeletal: back.   ?Tenderness to  palpation: no ?Deformity: no ?Ecchymosis: no ?Tests positive: none ?Tests negative: straight leg ?Surprisingly good hamstring flexibility ?No TTP over quads b/l ?Skin: Hyperkeratotic skin over heels and lateral plantar forefoot w ttp; no erythema, fluctuance, drainage ?Neurologic: Normal sensory function. No focal deficits noted. DTR's equal and symmetric in LE's. No clonus. ?Psychiatric: Normal mood. Age appropriate judgment and insight. Alert & oriented x 3.   ? ?Procedure note: callus shaving ?Verbal consent obtained ?15 blade scalpel used on 4 separate lesions, indication: pain ?Lateral forefoot R, lateral forefoot L, heel R, heel L each had hyperkeratinization which were pared down using above scalpel. ?Pt tolerated procedure well with no immediate complications. ?Reported immediate improvement. ? ?Assessment: ? ?Seasonal allergies - Plan: fluticasone (FLONASE) 50 MCG/ACT nasal spray, levocetirizine (XYZAL) 5 MG tablet ? ?Corns and callus - Plan: Ambulatory referral to Podiatry ? ?Abnormal laboratory test - Plan: CBC ? ?Screening for colon cancer - Plan: Ambulatory referral to Gastroenterology ? ?Chronic bilateral low back pain without sciatica ? ?Plan: ?Start Xyzal and Flonase.  Send message if eyes not improved. ?Callus pared down today, had immediate improvement noted, will refer to podiatry for further evaluation and management. ?She reports an abnormal lab test done by her dialysis center.  We will follow-up on CBC.  She is not having any signs or symptoms of infection. ?Stretches/exercises, heat, ice, Tylenol.  ?F/u in 1 month to recheck #  1 ?The patient voiced understanding and agreement to the plan. ? ?I spent 30 minutes with the patient discussing the above plan in addition to reviewing her chart on the same day of the visit. This was in excess of the time spent on the procedure.  ? ? ?[place], DO ?11/19/21  ?2:03 PM ? ?

## 2021-11-24 DIAGNOSIS — N2581 Secondary hyperparathyroidism of renal origin: Secondary | ICD-10-CM | POA: Diagnosis not present

## 2021-11-24 DIAGNOSIS — D509 Iron deficiency anemia, unspecified: Secondary | ICD-10-CM | POA: Diagnosis not present

## 2021-11-24 DIAGNOSIS — N186 End stage renal disease: Secondary | ICD-10-CM | POA: Diagnosis not present

## 2021-11-25 DIAGNOSIS — N186 End stage renal disease: Secondary | ICD-10-CM | POA: Diagnosis not present

## 2021-11-25 DIAGNOSIS — Z992 Dependence on renal dialysis: Secondary | ICD-10-CM | POA: Diagnosis not present

## 2021-11-27 DIAGNOSIS — N2581 Secondary hyperparathyroidism of renal origin: Secondary | ICD-10-CM | POA: Diagnosis not present

## 2021-11-27 DIAGNOSIS — N186 End stage renal disease: Secondary | ICD-10-CM | POA: Diagnosis not present

## 2021-11-27 DIAGNOSIS — D509 Iron deficiency anemia, unspecified: Secondary | ICD-10-CM | POA: Diagnosis not present

## 2021-11-27 DIAGNOSIS — D631 Anemia in chronic kidney disease: Secondary | ICD-10-CM | POA: Diagnosis not present

## 2021-11-29 DIAGNOSIS — D631 Anemia in chronic kidney disease: Secondary | ICD-10-CM | POA: Diagnosis not present

## 2021-11-29 DIAGNOSIS — D509 Iron deficiency anemia, unspecified: Secondary | ICD-10-CM | POA: Diagnosis not present

## 2021-11-29 DIAGNOSIS — N186 End stage renal disease: Secondary | ICD-10-CM | POA: Diagnosis not present

## 2021-11-29 DIAGNOSIS — N2581 Secondary hyperparathyroidism of renal origin: Secondary | ICD-10-CM | POA: Diagnosis not present

## 2021-11-30 ENCOUNTER — Ambulatory Visit (INDEPENDENT_AMBULATORY_CARE_PROVIDER_SITE_OTHER): Payer: Medicare HMO | Admitting: Podiatry

## 2021-11-30 ENCOUNTER — Encounter: Payer: Self-pay | Admitting: Podiatry

## 2021-11-30 DIAGNOSIS — M79675 Pain in left toe(s): Secondary | ICD-10-CM | POA: Diagnosis not present

## 2021-11-30 DIAGNOSIS — B351 Tinea unguium: Secondary | ICD-10-CM

## 2021-11-30 DIAGNOSIS — M79674 Pain in right toe(s): Secondary | ICD-10-CM

## 2021-11-30 DIAGNOSIS — L84 Corns and callosities: Secondary | ICD-10-CM | POA: Diagnosis not present

## 2021-11-30 DIAGNOSIS — N186 End stage renal disease: Secondary | ICD-10-CM | POA: Diagnosis not present

## 2021-11-30 DIAGNOSIS — B353 Tinea pedis: Secondary | ICD-10-CM

## 2021-11-30 MED ORDER — KETOCONAZOLE 2 % EX CREA: 1.0000 "application " | TOPICAL_CREAM | Freq: Every day | CUTANEOUS | 2 refills | Status: AC

## 2021-11-30 NOTE — Progress Notes (Signed)
?  Subjective:  ?Patient ID: Karen Carr, female    DOB: [birth year],   MRN: [id number] ? ?Chief Complaint  ?Patient presents with  ? Foot Pain  ?  Plantar foot bilateral - multiple callused areas x years, has to trim herself every 2 weeks just to walk comfortably  ? New Patient (Initial Visit)  ?  Est pt 2019  ? ? ?66 y.o. female presents for concern of bilateral calluses on plantar feet that have been present for several years. Relates they are painful when she is walking. She tries to trim them down but they come back. She also relates itchiness and dryness on the bottom of her feet. Also has concern for her toenails and requesting to have them trimmed today. She is not diabetic and not on any blood thinners.  . Denies any other pedal complaints. Denies n/v/f/c.  ? ?Past Medical History:  ?Diagnosis Date  ? ESRD (end stage renal disease) ([facility])   ? Gout   ? Hyperlipidemia   ? Hypertension   ? Major depressive disorder   ? SLE (systemic lupus erythematosus) ([facility])   ? Vitamin D deficiency   ? ? ?Objective:  ?Physical Exam: ?Vascular: DP/PT pulses 2/4 bilateral. CFT <3 seconds. Normal hair growth on digits. No edema.  ?Skin. No lacerations or abrasions bilateral feet. Hyperkeratotic lesions sub fourth metatarsal on left and sub fifth metatarsal on right. Sub plantar heels bilateral. Naisl 1-5 b/l are thickened and elongated with subungual debris. Also has some scaling and xerosis noted to plantar foot.  ?Musculoskeletal: MMT 5/5 bilateral lower extremities in DF, PF, Inversion and Eversion. Deceased ROM in DF of ankle joint.  ?Neurological: Sensation intact to light touch.  ? ?Assessment:  ? ?1. Corns and callosities   ?2. Tinea pedis of both feet   ?3. ESRD (end stage renal disease) ([facility])   ? ? ? ?Plan:  ?Patient was evaluated and treated and all questions answered. ?-Discussed corns and calluses with patient and treatment options.  ?-Hyperkeratotic tissue was debrided with chisel without incident.  ?-Applied  salycylic acid treatment to area with dressing. Advised to remove bandaging tomorrow.  ?-Encouraged daily moisturizing ?-Discussed use of pumice stone ?-Advised good supportive shoes and inserts ?-Nails 1-5 debrided without incident.  ?-Powersteps dispensed.  ?-Ketoconazole dispensed.  ?-Patient to return to office as needed or sooner if condition worsens. ? ? ?Lorenda Peck, DPM  ? ? ?

## 2021-12-01 DIAGNOSIS — N2581 Secondary hyperparathyroidism of renal origin: Secondary | ICD-10-CM | POA: Diagnosis not present

## 2021-12-01 DIAGNOSIS — D509 Iron deficiency anemia, unspecified: Secondary | ICD-10-CM | POA: Diagnosis not present

## 2021-12-01 DIAGNOSIS — D631 Anemia in chronic kidney disease: Secondary | ICD-10-CM | POA: Diagnosis not present

## 2021-12-01 DIAGNOSIS — N186 End stage renal disease: Secondary | ICD-10-CM | POA: Diagnosis not present

## 2021-12-03 ENCOUNTER — Encounter: Payer: Medicare HMO | Admitting: Family Medicine

## 2021-12-04 DIAGNOSIS — D631 Anemia in chronic kidney disease: Secondary | ICD-10-CM | POA: Diagnosis not present

## 2021-12-04 DIAGNOSIS — D509 Iron deficiency anemia, unspecified: Secondary | ICD-10-CM | POA: Diagnosis not present

## 2021-12-04 DIAGNOSIS — N2581 Secondary hyperparathyroidism of renal origin: Secondary | ICD-10-CM | POA: Diagnosis not present

## 2021-12-04 DIAGNOSIS — N186 End stage renal disease: Secondary | ICD-10-CM | POA: Diagnosis not present

## 2021-12-08 DIAGNOSIS — N186 End stage renal disease: Secondary | ICD-10-CM | POA: Diagnosis not present

## 2021-12-08 DIAGNOSIS — D631 Anemia in chronic kidney disease: Secondary | ICD-10-CM | POA: Diagnosis not present

## 2021-12-08 DIAGNOSIS — N2581 Secondary hyperparathyroidism of renal origin: Secondary | ICD-10-CM | POA: Diagnosis not present

## 2021-12-08 DIAGNOSIS — D509 Iron deficiency anemia, unspecified: Secondary | ICD-10-CM | POA: Diagnosis not present

## 2021-12-11 DIAGNOSIS — D631 Anemia in chronic kidney disease: Secondary | ICD-10-CM | POA: Diagnosis not present

## 2021-12-11 DIAGNOSIS — D509 Iron deficiency anemia, unspecified: Secondary | ICD-10-CM | POA: Diagnosis not present

## 2021-12-11 DIAGNOSIS — N2581 Secondary hyperparathyroidism of renal origin: Secondary | ICD-10-CM | POA: Diagnosis not present

## 2021-12-11 DIAGNOSIS — N186 End stage renal disease: Secondary | ICD-10-CM | POA: Diagnosis not present

## 2021-12-12 ENCOUNTER — Encounter: Payer: Self-pay | Admitting: Family Medicine

## 2021-12-12 ENCOUNTER — Encounter: Payer: Medicare HMO | Admitting: Family Medicine

## 2021-12-15 DIAGNOSIS — Z20822 Contact with and (suspected) exposure to covid-19: Secondary | ICD-10-CM | POA: Diagnosis not present

## 2021-12-15 DIAGNOSIS — I120 Hypertensive chronic kidney disease with stage 5 chronic kidney disease or end stage renal disease: Secondary | ICD-10-CM | POA: Diagnosis not present

## 2021-12-15 DIAGNOSIS — J90 Pleural effusion, not elsewhere classified: Secondary | ICD-10-CM | POA: Diagnosis not present

## 2021-12-15 DIAGNOSIS — R0602 Shortness of breath: Secondary | ICD-10-CM | POA: Diagnosis not present

## 2021-12-15 DIAGNOSIS — F17210 Nicotine dependence, cigarettes, uncomplicated: Secondary | ICD-10-CM | POA: Diagnosis not present

## 2021-12-15 DIAGNOSIS — E872 Acidosis, unspecified: Secondary | ICD-10-CM | POA: Diagnosis not present

## 2021-12-15 DIAGNOSIS — R0789 Other chest pain: Secondary | ICD-10-CM | POA: Diagnosis not present

## 2021-12-15 DIAGNOSIS — J811 Chronic pulmonary edema: Secondary | ICD-10-CM | POA: Diagnosis not present

## 2021-12-15 DIAGNOSIS — Z992 Dependence on renal dialysis: Secondary | ICD-10-CM | POA: Diagnosis not present

## 2021-12-15 DIAGNOSIS — R7989 Other specified abnormal findings of blood chemistry: Secondary | ICD-10-CM | POA: Diagnosis not present

## 2021-12-15 DIAGNOSIS — N2581 Secondary hyperparathyroidism of renal origin: Secondary | ICD-10-CM | POA: Diagnosis not present

## 2021-12-15 DIAGNOSIS — R079 Chest pain, unspecified: Secondary | ICD-10-CM | POA: Diagnosis not present

## 2021-12-15 DIAGNOSIS — E875 Hyperkalemia: Secondary | ICD-10-CM | POA: Diagnosis not present

## 2021-12-15 DIAGNOSIS — I10 Essential (primary) hypertension: Secondary | ICD-10-CM | POA: Diagnosis not present

## 2021-12-15 DIAGNOSIS — R000 Tachycardia, unspecified: Secondary | ICD-10-CM | POA: Diagnosis not present

## 2021-12-15 DIAGNOSIS — Z79899 Other long term (current) drug therapy: Secondary | ICD-10-CM | POA: Diagnosis not present

## 2021-12-15 DIAGNOSIS — N186 End stage renal disease: Secondary | ICD-10-CM | POA: Diagnosis not present

## 2021-12-16 DIAGNOSIS — R9431 Abnormal electrocardiogram [ECG] [EKG]: Secondary | ICD-10-CM | POA: Diagnosis not present

## 2021-12-20 DIAGNOSIS — F17210 Nicotine dependence, cigarettes, uncomplicated: Secondary | ICD-10-CM | POA: Diagnosis not present

## 2021-12-20 DIAGNOSIS — R069 Unspecified abnormalities of breathing: Secondary | ICD-10-CM | POA: Diagnosis not present

## 2021-12-20 DIAGNOSIS — R079 Chest pain, unspecified: Secondary | ICD-10-CM | POA: Diagnosis not present

## 2021-12-20 DIAGNOSIS — I350 Nonrheumatic aortic (valve) stenosis: Secondary | ICD-10-CM | POA: Diagnosis not present

## 2021-12-20 DIAGNOSIS — E8770 Fluid overload, unspecified: Secondary | ICD-10-CM | POA: Diagnosis not present

## 2021-12-20 DIAGNOSIS — R918 Other nonspecific abnormal finding of lung field: Secondary | ICD-10-CM | POA: Diagnosis not present

## 2021-12-20 DIAGNOSIS — Z91158 Patient's noncompliance with renal dialysis for other reason: Secondary | ICD-10-CM | POA: Diagnosis not present

## 2021-12-20 DIAGNOSIS — N2581 Secondary hyperparathyroidism of renal origin: Secondary | ICD-10-CM | POA: Diagnosis not present

## 2021-12-20 DIAGNOSIS — I214 Non-ST elevation (NSTEMI) myocardial infarction: Secondary | ICD-10-CM | POA: Diagnosis not present

## 2021-12-20 DIAGNOSIS — I352 Nonrheumatic aortic (valve) stenosis with insufficiency: Secondary | ICD-10-CM | POA: Diagnosis not present

## 2021-12-20 DIAGNOSIS — N186 End stage renal disease: Secondary | ICD-10-CM | POA: Diagnosis not present

## 2021-12-20 DIAGNOSIS — I160 Hypertensive urgency: Secondary | ICD-10-CM | POA: Diagnosis not present

## 2021-12-20 DIAGNOSIS — I351 Nonrheumatic aortic (valve) insufficiency: Secondary | ICD-10-CM | POA: Diagnosis not present

## 2021-12-20 DIAGNOSIS — I10 Essential (primary) hypertension: Secondary | ICD-10-CM | POA: Diagnosis not present

## 2021-12-20 DIAGNOSIS — Z79899 Other long term (current) drug therapy: Secondary | ICD-10-CM | POA: Diagnosis not present

## 2021-12-20 DIAGNOSIS — E872 Acidosis, unspecified: Secondary | ICD-10-CM | POA: Diagnosis not present

## 2021-12-20 DIAGNOSIS — Z992 Dependence on renal dialysis: Secondary | ICD-10-CM | POA: Diagnosis not present

## 2021-12-20 DIAGNOSIS — I120 Hypertensive chronic kidney disease with stage 5 chronic kidney disease or end stage renal disease: Secondary | ICD-10-CM | POA: Diagnosis not present

## 2021-12-20 DIAGNOSIS — E875 Hyperkalemia: Secondary | ICD-10-CM | POA: Diagnosis not present

## 2021-12-21 DIAGNOSIS — I350 Nonrheumatic aortic (valve) stenosis: Secondary | ICD-10-CM | POA: Diagnosis not present

## 2021-12-21 DIAGNOSIS — N186 End stage renal disease: Secondary | ICD-10-CM | POA: Diagnosis not present

## 2021-12-21 DIAGNOSIS — I160 Hypertensive urgency: Secondary | ICD-10-CM | POA: Diagnosis not present

## 2021-12-21 DIAGNOSIS — I3139 Other pericardial effusion (noninflammatory): Secondary | ICD-10-CM | POA: Diagnosis not present

## 2021-12-21 DIAGNOSIS — Z992 Dependence on renal dialysis: Secondary | ICD-10-CM | POA: Diagnosis not present

## 2021-12-21 DIAGNOSIS — J810 Acute pulmonary edema: Secondary | ICD-10-CM | POA: Diagnosis not present

## 2021-12-21 DIAGNOSIS — I120 Hypertensive chronic kidney disease with stage 5 chronic kidney disease or end stage renal disease: Secondary | ICD-10-CM | POA: Diagnosis not present

## 2021-12-21 DIAGNOSIS — E8770 Fluid overload, unspecified: Secondary | ICD-10-CM | POA: Diagnosis not present

## 2021-12-21 DIAGNOSIS — Z91158 Patient's noncompliance with renal dialysis for other reason: Secondary | ICD-10-CM | POA: Diagnosis not present

## 2021-12-21 DIAGNOSIS — I214 Non-ST elevation (NSTEMI) myocardial infarction: Secondary | ICD-10-CM | POA: Diagnosis not present

## 2021-12-21 DIAGNOSIS — I351 Nonrheumatic aortic (valve) insufficiency: Secondary | ICD-10-CM | POA: Diagnosis not present

## 2021-12-21 DIAGNOSIS — R0602 Shortness of breath: Secondary | ICD-10-CM | POA: Diagnosis not present

## 2021-12-21 DIAGNOSIS — D631 Anemia in chronic kidney disease: Secondary | ICD-10-CM | POA: Diagnosis not present

## 2021-12-22 DIAGNOSIS — N186 End stage renal disease: Secondary | ICD-10-CM | POA: Diagnosis not present

## 2021-12-22 DIAGNOSIS — D631 Anemia in chronic kidney disease: Secondary | ICD-10-CM | POA: Diagnosis not present

## 2021-12-22 DIAGNOSIS — N2581 Secondary hyperparathyroidism of renal origin: Secondary | ICD-10-CM | POA: Diagnosis not present

## 2021-12-22 DIAGNOSIS — D509 Iron deficiency anemia, unspecified: Secondary | ICD-10-CM | POA: Diagnosis not present

## 2021-12-25 DIAGNOSIS — D631 Anemia in chronic kidney disease: Secondary | ICD-10-CM | POA: Diagnosis not present

## 2021-12-25 DIAGNOSIS — N2581 Secondary hyperparathyroidism of renal origin: Secondary | ICD-10-CM | POA: Diagnosis not present

## 2021-12-25 DIAGNOSIS — N186 End stage renal disease: Secondary | ICD-10-CM | POA: Diagnosis not present

## 2021-12-25 DIAGNOSIS — D509 Iron deficiency anemia, unspecified: Secondary | ICD-10-CM | POA: Diagnosis not present

## 2021-12-26 ENCOUNTER — Encounter: Payer: Self-pay | Admitting: Family Medicine

## 2021-12-26 ENCOUNTER — Inpatient Hospital Stay: Payer: Medicare HMO | Admitting: Family Medicine

## 2021-12-26 DIAGNOSIS — Z992 Dependence on renal dialysis: Secondary | ICD-10-CM | POA: Diagnosis not present

## 2021-12-26 DIAGNOSIS — N186 End stage renal disease: Secondary | ICD-10-CM | POA: Diagnosis not present

## 2021-12-27 DIAGNOSIS — N186 End stage renal disease: Secondary | ICD-10-CM | POA: Diagnosis not present

## 2021-12-27 DIAGNOSIS — D631 Anemia in chronic kidney disease: Secondary | ICD-10-CM | POA: Diagnosis not present

## 2021-12-27 DIAGNOSIS — D509 Iron deficiency anemia, unspecified: Secondary | ICD-10-CM | POA: Diagnosis not present

## 2021-12-27 DIAGNOSIS — N2581 Secondary hyperparathyroidism of renal origin: Secondary | ICD-10-CM | POA: Diagnosis not present

## 2021-12-29 DIAGNOSIS — D631 Anemia in chronic kidney disease: Secondary | ICD-10-CM | POA: Diagnosis not present

## 2021-12-29 DIAGNOSIS — N186 End stage renal disease: Secondary | ICD-10-CM | POA: Diagnosis not present

## 2021-12-29 DIAGNOSIS — D509 Iron deficiency anemia, unspecified: Secondary | ICD-10-CM | POA: Diagnosis not present

## 2021-12-29 DIAGNOSIS — Z1159 Encounter for screening for other viral diseases: Secondary | ICD-10-CM | POA: Diagnosis not present

## 2021-12-29 DIAGNOSIS — N2581 Secondary hyperparathyroidism of renal origin: Secondary | ICD-10-CM | POA: Diagnosis not present

## 2022-01-01 DIAGNOSIS — N2581 Secondary hyperparathyroidism of renal origin: Secondary | ICD-10-CM | POA: Diagnosis not present

## 2022-01-01 DIAGNOSIS — D509 Iron deficiency anemia, unspecified: Secondary | ICD-10-CM | POA: Diagnosis not present

## 2022-01-01 DIAGNOSIS — N186 End stage renal disease: Secondary | ICD-10-CM | POA: Diagnosis not present

## 2022-01-01 DIAGNOSIS — D631 Anemia in chronic kidney disease: Secondary | ICD-10-CM | POA: Diagnosis not present

## 2022-01-05 DIAGNOSIS — D631 Anemia in chronic kidney disease: Secondary | ICD-10-CM | POA: Diagnosis not present

## 2022-01-05 DIAGNOSIS — N2581 Secondary hyperparathyroidism of renal origin: Secondary | ICD-10-CM | POA: Diagnosis not present

## 2022-01-05 DIAGNOSIS — D509 Iron deficiency anemia, unspecified: Secondary | ICD-10-CM | POA: Diagnosis not present

## 2022-01-05 DIAGNOSIS — N186 End stage renal disease: Secondary | ICD-10-CM | POA: Diagnosis not present

## 2022-01-08 DIAGNOSIS — D509 Iron deficiency anemia, unspecified: Secondary | ICD-10-CM | POA: Diagnosis not present

## 2022-01-08 DIAGNOSIS — I10 Essential (primary) hypertension: Secondary | ICD-10-CM | POA: Diagnosis not present

## 2022-01-08 DIAGNOSIS — R739 Hyperglycemia, unspecified: Secondary | ICD-10-CM | POA: Diagnosis not present

## 2022-01-08 DIAGNOSIS — D631 Anemia in chronic kidney disease: Secondary | ICD-10-CM | POA: Diagnosis not present

## 2022-01-08 DIAGNOSIS — F411 Generalized anxiety disorder: Secondary | ICD-10-CM | POA: Diagnosis not present

## 2022-01-08 DIAGNOSIS — E559 Vitamin D deficiency, unspecified: Secondary | ICD-10-CM | POA: Diagnosis not present

## 2022-01-08 DIAGNOSIS — Z0001 Encounter for general adult medical examination with abnormal findings: Secondary | ICD-10-CM | POA: Diagnosis not present

## 2022-01-08 DIAGNOSIS — M329 Systemic lupus erythematosus, unspecified: Secondary | ICD-10-CM | POA: Diagnosis not present

## 2022-01-08 DIAGNOSIS — F331 Major depressive disorder, recurrent, moderate: Secondary | ICD-10-CM | POA: Diagnosis not present

## 2022-01-08 DIAGNOSIS — E782 Mixed hyperlipidemia: Secondary | ICD-10-CM | POA: Diagnosis not present

## 2022-01-08 DIAGNOSIS — N186 End stage renal disease: Secondary | ICD-10-CM | POA: Diagnosis not present

## 2022-01-08 DIAGNOSIS — N2581 Secondary hyperparathyroidism of renal origin: Secondary | ICD-10-CM | POA: Diagnosis not present

## 2022-01-08 DIAGNOSIS — R5383 Other fatigue: Secondary | ICD-10-CM | POA: Diagnosis not present

## 2022-01-10 DIAGNOSIS — N186 End stage renal disease: Secondary | ICD-10-CM | POA: Diagnosis not present

## 2022-01-10 DIAGNOSIS — N2581 Secondary hyperparathyroidism of renal origin: Secondary | ICD-10-CM | POA: Diagnosis not present

## 2022-01-10 DIAGNOSIS — D631 Anemia in chronic kidney disease: Secondary | ICD-10-CM | POA: Diagnosis not present

## 2022-01-10 DIAGNOSIS — D509 Iron deficiency anemia, unspecified: Secondary | ICD-10-CM | POA: Diagnosis not present

## 2022-01-15 DIAGNOSIS — D509 Iron deficiency anemia, unspecified: Secondary | ICD-10-CM | POA: Diagnosis not present

## 2022-01-15 DIAGNOSIS — D631 Anemia in chronic kidney disease: Secondary | ICD-10-CM | POA: Diagnosis not present

## 2022-01-15 DIAGNOSIS — N2581 Secondary hyperparathyroidism of renal origin: Secondary | ICD-10-CM | POA: Diagnosis not present

## 2022-01-15 DIAGNOSIS — N186 End stage renal disease: Secondary | ICD-10-CM | POA: Diagnosis not present

## 2022-01-17 DIAGNOSIS — D509 Iron deficiency anemia, unspecified: Secondary | ICD-10-CM | POA: Diagnosis not present

## 2022-01-17 DIAGNOSIS — D631 Anemia in chronic kidney disease: Secondary | ICD-10-CM | POA: Diagnosis not present

## 2022-01-17 DIAGNOSIS — N186 End stage renal disease: Secondary | ICD-10-CM | POA: Diagnosis not present

## 2022-01-17 DIAGNOSIS — N2581 Secondary hyperparathyroidism of renal origin: Secondary | ICD-10-CM | POA: Diagnosis not present

## 2022-01-19 DIAGNOSIS — D509 Iron deficiency anemia, unspecified: Secondary | ICD-10-CM | POA: Diagnosis not present

## 2022-01-19 DIAGNOSIS — N186 End stage renal disease: Secondary | ICD-10-CM | POA: Diagnosis not present

## 2022-01-19 DIAGNOSIS — N2581 Secondary hyperparathyroidism of renal origin: Secondary | ICD-10-CM | POA: Diagnosis not present

## 2022-01-19 DIAGNOSIS — D631 Anemia in chronic kidney disease: Secondary | ICD-10-CM | POA: Diagnosis not present

## 2022-01-20 ENCOUNTER — Other Ambulatory Visit: Payer: Self-pay | Admitting: Family Medicine

## 2022-01-20 DIAGNOSIS — M329 Systemic lupus erythematosus, unspecified: Secondary | ICD-10-CM

## 2022-01-22 ENCOUNTER — Other Ambulatory Visit: Payer: Self-pay | Admitting: Family Medicine

## 2022-01-22 DIAGNOSIS — M329 Systemic lupus erythematosus, unspecified: Secondary | ICD-10-CM

## 2022-01-24 DIAGNOSIS — N186 End stage renal disease: Secondary | ICD-10-CM | POA: Diagnosis not present

## 2022-01-24 DIAGNOSIS — D509 Iron deficiency anemia, unspecified: Secondary | ICD-10-CM | POA: Diagnosis not present

## 2022-01-24 DIAGNOSIS — D631 Anemia in chronic kidney disease: Secondary | ICD-10-CM | POA: Diagnosis not present

## 2022-01-24 DIAGNOSIS — N2581 Secondary hyperparathyroidism of renal origin: Secondary | ICD-10-CM | POA: Diagnosis not present

## 2022-01-25 DIAGNOSIS — N186 End stage renal disease: Secondary | ICD-10-CM | POA: Diagnosis not present

## 2022-01-25 DIAGNOSIS — Z992 Dependence on renal dialysis: Secondary | ICD-10-CM | POA: Diagnosis not present

## 2022-01-26 DIAGNOSIS — N2581 Secondary hyperparathyroidism of renal origin: Secondary | ICD-10-CM | POA: Diagnosis not present

## 2022-01-26 DIAGNOSIS — D631 Anemia in chronic kidney disease: Secondary | ICD-10-CM | POA: Diagnosis not present

## 2022-01-26 DIAGNOSIS — N186 End stage renal disease: Secondary | ICD-10-CM | POA: Diagnosis not present

## 2022-01-26 DIAGNOSIS — D509 Iron deficiency anemia, unspecified: Secondary | ICD-10-CM | POA: Diagnosis not present

## 2022-01-28 ENCOUNTER — Encounter: Payer: Self-pay | Admitting: Family Medicine

## 2022-01-29 DIAGNOSIS — D509 Iron deficiency anemia, unspecified: Secondary | ICD-10-CM | POA: Diagnosis not present

## 2022-01-29 DIAGNOSIS — N2581 Secondary hyperparathyroidism of renal origin: Secondary | ICD-10-CM | POA: Diagnosis not present

## 2022-01-29 DIAGNOSIS — D631 Anemia in chronic kidney disease: Secondary | ICD-10-CM | POA: Diagnosis not present

## 2022-01-29 DIAGNOSIS — N186 End stage renal disease: Secondary | ICD-10-CM | POA: Diagnosis not present

## 2022-01-31 ENCOUNTER — Other Ambulatory Visit: Payer: Self-pay | Admitting: Family Medicine

## 2022-02-02 DIAGNOSIS — D631 Anemia in chronic kidney disease: Secondary | ICD-10-CM | POA: Diagnosis not present

## 2022-02-02 DIAGNOSIS — N2581 Secondary hyperparathyroidism of renal origin: Secondary | ICD-10-CM | POA: Diagnosis not present

## 2022-02-02 DIAGNOSIS — N186 End stage renal disease: Secondary | ICD-10-CM | POA: Diagnosis not present

## 2022-02-02 DIAGNOSIS — D509 Iron deficiency anemia, unspecified: Secondary | ICD-10-CM | POA: Diagnosis not present

## 2022-02-07 DIAGNOSIS — F411 Generalized anxiety disorder: Secondary | ICD-10-CM | POA: Diagnosis not present

## 2022-02-07 DIAGNOSIS — D631 Anemia in chronic kidney disease: Secondary | ICD-10-CM | POA: Diagnosis not present

## 2022-02-07 DIAGNOSIS — G603 Idiopathic progressive neuropathy: Secondary | ICD-10-CM | POA: Diagnosis not present

## 2022-02-07 DIAGNOSIS — R5383 Other fatigue: Secondary | ICD-10-CM | POA: Diagnosis not present

## 2022-02-07 DIAGNOSIS — F331 Major depressive disorder, recurrent, moderate: Secondary | ICD-10-CM | POA: Diagnosis not present

## 2022-02-07 DIAGNOSIS — E559 Vitamin D deficiency, unspecified: Secondary | ICD-10-CM | POA: Diagnosis not present

## 2022-02-07 DIAGNOSIS — N186 End stage renal disease: Secondary | ICD-10-CM | POA: Diagnosis not present

## 2022-02-07 DIAGNOSIS — N2581 Secondary hyperparathyroidism of renal origin: Secondary | ICD-10-CM | POA: Diagnosis not present

## 2022-02-07 DIAGNOSIS — R739 Hyperglycemia, unspecified: Secondary | ICD-10-CM | POA: Diagnosis not present

## 2022-02-07 DIAGNOSIS — E782 Mixed hyperlipidemia: Secondary | ICD-10-CM | POA: Diagnosis not present

## 2022-02-07 DIAGNOSIS — M329 Systemic lupus erythematosus, unspecified: Secondary | ICD-10-CM | POA: Diagnosis not present

## 2022-02-07 DIAGNOSIS — D509 Iron deficiency anemia, unspecified: Secondary | ICD-10-CM | POA: Diagnosis not present

## 2022-02-07 DIAGNOSIS — Z1159 Encounter for screening for other viral diseases: Secondary | ICD-10-CM | POA: Diagnosis not present

## 2022-02-07 DIAGNOSIS — I10 Essential (primary) hypertension: Secondary | ICD-10-CM | POA: Diagnosis not present

## 2022-02-09 DIAGNOSIS — D509 Iron deficiency anemia, unspecified: Secondary | ICD-10-CM | POA: Diagnosis not present

## 2022-02-09 DIAGNOSIS — D631 Anemia in chronic kidney disease: Secondary | ICD-10-CM | POA: Diagnosis not present

## 2022-02-09 DIAGNOSIS — N2581 Secondary hyperparathyroidism of renal origin: Secondary | ICD-10-CM | POA: Diagnosis not present

## 2022-02-09 DIAGNOSIS — N186 End stage renal disease: Secondary | ICD-10-CM | POA: Diagnosis not present

## 2022-02-12 ENCOUNTER — Encounter: Payer: Self-pay | Admitting: Family Medicine

## 2022-02-12 DIAGNOSIS — D631 Anemia in chronic kidney disease: Secondary | ICD-10-CM | POA: Diagnosis not present

## 2022-02-12 DIAGNOSIS — N2581 Secondary hyperparathyroidism of renal origin: Secondary | ICD-10-CM | POA: Diagnosis not present

## 2022-02-12 DIAGNOSIS — D509 Iron deficiency anemia, unspecified: Secondary | ICD-10-CM | POA: Diagnosis not present

## 2022-02-12 DIAGNOSIS — N186 End stage renal disease: Secondary | ICD-10-CM | POA: Diagnosis not present

## 2022-02-16 DIAGNOSIS — M25511 Pain in right shoulder: Secondary | ICD-10-CM | POA: Diagnosis not present

## 2022-02-16 DIAGNOSIS — Z992 Dependence on renal dialysis: Secondary | ICD-10-CM | POA: Diagnosis not present

## 2022-02-16 DIAGNOSIS — Z79899 Other long term (current) drug therapy: Secondary | ICD-10-CM | POA: Diagnosis not present

## 2022-02-16 DIAGNOSIS — S098XXA Other specified injuries of head, initial encounter: Secondary | ICD-10-CM | POA: Diagnosis not present

## 2022-02-16 DIAGNOSIS — M545 Low back pain, unspecified: Secondary | ICD-10-CM | POA: Diagnosis not present

## 2022-02-16 DIAGNOSIS — I10 Essential (primary) hypertension: Secondary | ICD-10-CM | POA: Diagnosis not present

## 2022-02-16 DIAGNOSIS — M5136 Other intervertebral disc degeneration, lumbar region: Secondary | ICD-10-CM | POA: Diagnosis not present

## 2022-02-16 DIAGNOSIS — G8911 Acute pain due to trauma: Secondary | ICD-10-CM | POA: Diagnosis not present

## 2022-02-16 DIAGNOSIS — S199XXA Unspecified injury of neck, initial encounter: Secondary | ICD-10-CM | POA: Diagnosis not present

## 2022-02-16 DIAGNOSIS — S0990XA Unspecified injury of head, initial encounter: Secondary | ICD-10-CM | POA: Diagnosis not present

## 2022-02-19 DIAGNOSIS — N2581 Secondary hyperparathyroidism of renal origin: Secondary | ICD-10-CM | POA: Diagnosis not present

## 2022-02-19 DIAGNOSIS — D631 Anemia in chronic kidney disease: Secondary | ICD-10-CM | POA: Diagnosis not present

## 2022-02-19 DIAGNOSIS — N186 End stage renal disease: Secondary | ICD-10-CM | POA: Diagnosis not present

## 2022-02-19 DIAGNOSIS — D509 Iron deficiency anemia, unspecified: Secondary | ICD-10-CM | POA: Diagnosis not present

## 2022-02-21 DIAGNOSIS — D509 Iron deficiency anemia, unspecified: Secondary | ICD-10-CM | POA: Diagnosis not present

## 2022-02-21 DIAGNOSIS — N2581 Secondary hyperparathyroidism of renal origin: Secondary | ICD-10-CM | POA: Diagnosis not present

## 2022-02-21 DIAGNOSIS — D631 Anemia in chronic kidney disease: Secondary | ICD-10-CM | POA: Diagnosis not present

## 2022-02-21 DIAGNOSIS — N186 End stage renal disease: Secondary | ICD-10-CM | POA: Diagnosis not present

## 2022-02-23 DIAGNOSIS — D509 Iron deficiency anemia, unspecified: Secondary | ICD-10-CM | POA: Diagnosis not present

## 2022-02-23 DIAGNOSIS — N2581 Secondary hyperparathyroidism of renal origin: Secondary | ICD-10-CM | POA: Diagnosis not present

## 2022-02-23 DIAGNOSIS — D631 Anemia in chronic kidney disease: Secondary | ICD-10-CM | POA: Diagnosis not present

## 2022-02-23 DIAGNOSIS — N186 End stage renal disease: Secondary | ICD-10-CM | POA: Diagnosis not present

## 2022-02-25 DIAGNOSIS — N186 End stage renal disease: Secondary | ICD-10-CM | POA: Diagnosis not present

## 2022-02-25 DIAGNOSIS — Z992 Dependence on renal dialysis: Secondary | ICD-10-CM | POA: Diagnosis not present

## 2022-02-26 DIAGNOSIS — D509 Iron deficiency anemia, unspecified: Secondary | ICD-10-CM | POA: Diagnosis not present

## 2022-02-26 DIAGNOSIS — D631 Anemia in chronic kidney disease: Secondary | ICD-10-CM | POA: Diagnosis not present

## 2022-02-26 DIAGNOSIS — N186 End stage renal disease: Secondary | ICD-10-CM | POA: Diagnosis not present

## 2022-02-26 DIAGNOSIS — N2581 Secondary hyperparathyroidism of renal origin: Secondary | ICD-10-CM | POA: Diagnosis not present

## 2022-02-28 ENCOUNTER — Other Ambulatory Visit: Payer: Self-pay | Admitting: Family Medicine

## 2022-02-28 DIAGNOSIS — F418 Other specified anxiety disorders: Secondary | ICD-10-CM

## 2022-03-02 DIAGNOSIS — N2581 Secondary hyperparathyroidism of renal origin: Secondary | ICD-10-CM | POA: Diagnosis not present

## 2022-03-02 DIAGNOSIS — D509 Iron deficiency anemia, unspecified: Secondary | ICD-10-CM | POA: Diagnosis not present

## 2022-03-02 DIAGNOSIS — D631 Anemia in chronic kidney disease: Secondary | ICD-10-CM | POA: Diagnosis not present

## 2022-03-02 DIAGNOSIS — N186 End stage renal disease: Secondary | ICD-10-CM | POA: Diagnosis not present

## 2022-03-05 DIAGNOSIS — D509 Iron deficiency anemia, unspecified: Secondary | ICD-10-CM | POA: Diagnosis not present

## 2022-03-05 DIAGNOSIS — N2581 Secondary hyperparathyroidism of renal origin: Secondary | ICD-10-CM | POA: Diagnosis not present

## 2022-03-05 DIAGNOSIS — N186 End stage renal disease: Secondary | ICD-10-CM | POA: Diagnosis not present

## 2022-03-05 DIAGNOSIS — D631 Anemia in chronic kidney disease: Secondary | ICD-10-CM | POA: Diagnosis not present

## 2022-03-06 DIAGNOSIS — G603 Idiopathic progressive neuropathy: Secondary | ICD-10-CM | POA: Diagnosis not present

## 2022-03-06 DIAGNOSIS — I10 Essential (primary) hypertension: Secondary | ICD-10-CM | POA: Diagnosis not present

## 2022-03-06 DIAGNOSIS — F331 Major depressive disorder, recurrent, moderate: Secondary | ICD-10-CM | POA: Diagnosis not present

## 2022-03-07 DIAGNOSIS — D509 Iron deficiency anemia, unspecified: Secondary | ICD-10-CM | POA: Diagnosis not present

## 2022-03-07 DIAGNOSIS — N186 End stage renal disease: Secondary | ICD-10-CM | POA: Diagnosis not present

## 2022-03-07 DIAGNOSIS — D631 Anemia in chronic kidney disease: Secondary | ICD-10-CM | POA: Diagnosis not present

## 2022-03-07 DIAGNOSIS — N2581 Secondary hyperparathyroidism of renal origin: Secondary | ICD-10-CM | POA: Diagnosis not present

## 2022-03-14 DIAGNOSIS — D631 Anemia in chronic kidney disease: Secondary | ICD-10-CM | POA: Diagnosis not present

## 2022-03-14 DIAGNOSIS — D509 Iron deficiency anemia, unspecified: Secondary | ICD-10-CM | POA: Diagnosis not present

## 2022-03-14 DIAGNOSIS — N2581 Secondary hyperparathyroidism of renal origin: Secondary | ICD-10-CM | POA: Diagnosis not present

## 2022-03-14 DIAGNOSIS — N186 End stage renal disease: Secondary | ICD-10-CM | POA: Diagnosis not present

## 2022-03-16 DIAGNOSIS — N186 End stage renal disease: Secondary | ICD-10-CM | POA: Diagnosis not present

## 2022-03-16 DIAGNOSIS — D509 Iron deficiency anemia, unspecified: Secondary | ICD-10-CM | POA: Diagnosis not present

## 2022-03-16 DIAGNOSIS — N2581 Secondary hyperparathyroidism of renal origin: Secondary | ICD-10-CM | POA: Diagnosis not present

## 2022-03-16 DIAGNOSIS — D631 Anemia in chronic kidney disease: Secondary | ICD-10-CM | POA: Diagnosis not present

## 2022-03-21 DIAGNOSIS — D509 Iron deficiency anemia, unspecified: Secondary | ICD-10-CM | POA: Diagnosis not present

## 2022-03-21 DIAGNOSIS — N186 End stage renal disease: Secondary | ICD-10-CM | POA: Diagnosis not present

## 2022-03-21 DIAGNOSIS — D631 Anemia in chronic kidney disease: Secondary | ICD-10-CM | POA: Diagnosis not present

## 2022-03-21 DIAGNOSIS — N2581 Secondary hyperparathyroidism of renal origin: Secondary | ICD-10-CM | POA: Diagnosis not present

## 2022-03-25 DIAGNOSIS — M2041 Other hammer toe(s) (acquired), right foot: Secondary | ICD-10-CM | POA: Diagnosis not present

## 2022-03-25 DIAGNOSIS — N185 Chronic kidney disease, stage 5: Secondary | ICD-10-CM | POA: Diagnosis not present

## 2022-03-25 DIAGNOSIS — D2371 Other benign neoplasm of skin of right lower limb, including hip: Secondary | ICD-10-CM | POA: Diagnosis not present

## 2022-03-25 DIAGNOSIS — D2372 Other benign neoplasm of skin of left lower limb, including hip: Secondary | ICD-10-CM | POA: Diagnosis not present

## 2022-03-25 DIAGNOSIS — B351 Tinea unguium: Secondary | ICD-10-CM | POA: Diagnosis not present

## 2022-03-25 DIAGNOSIS — L84 Corns and callosities: Secondary | ICD-10-CM | POA: Diagnosis not present

## 2022-03-28 DIAGNOSIS — N186 End stage renal disease: Secondary | ICD-10-CM | POA: Diagnosis not present

## 2022-03-28 DIAGNOSIS — Z992 Dependence on renal dialysis: Secondary | ICD-10-CM | POA: Diagnosis not present

## 2022-03-28 DIAGNOSIS — N2581 Secondary hyperparathyroidism of renal origin: Secondary | ICD-10-CM | POA: Diagnosis not present

## 2022-03-28 DIAGNOSIS — D631 Anemia in chronic kidney disease: Secondary | ICD-10-CM | POA: Diagnosis not present

## 2022-03-28 DIAGNOSIS — D509 Iron deficiency anemia, unspecified: Secondary | ICD-10-CM | POA: Diagnosis not present

## 2022-03-30 DIAGNOSIS — N2581 Secondary hyperparathyroidism of renal origin: Secondary | ICD-10-CM | POA: Diagnosis not present

## 2022-03-30 DIAGNOSIS — R197 Diarrhea, unspecified: Secondary | ICD-10-CM | POA: Diagnosis not present

## 2022-03-30 DIAGNOSIS — N186 End stage renal disease: Secondary | ICD-10-CM | POA: Diagnosis not present

## 2022-03-30 DIAGNOSIS — D631 Anemia in chronic kidney disease: Secondary | ICD-10-CM | POA: Diagnosis not present

## 2022-03-30 DIAGNOSIS — D509 Iron deficiency anemia, unspecified: Secondary | ICD-10-CM | POA: Diagnosis not present

## 2022-03-30 DIAGNOSIS — Z23 Encounter for immunization: Secondary | ICD-10-CM | POA: Diagnosis not present

## 2022-04-02 DIAGNOSIS — N2581 Secondary hyperparathyroidism of renal origin: Secondary | ICD-10-CM | POA: Diagnosis not present

## 2022-04-02 DIAGNOSIS — N186 End stage renal disease: Secondary | ICD-10-CM | POA: Diagnosis not present

## 2022-04-02 DIAGNOSIS — D509 Iron deficiency anemia, unspecified: Secondary | ICD-10-CM | POA: Diagnosis not present

## 2022-04-02 DIAGNOSIS — R197 Diarrhea, unspecified: Secondary | ICD-10-CM | POA: Diagnosis not present

## 2022-04-02 DIAGNOSIS — D631 Anemia in chronic kidney disease: Secondary | ICD-10-CM | POA: Diagnosis not present

## 2022-04-02 DIAGNOSIS — Z23 Encounter for immunization: Secondary | ICD-10-CM | POA: Diagnosis not present

## 2022-04-09 DIAGNOSIS — D631 Anemia in chronic kidney disease: Secondary | ICD-10-CM | POA: Diagnosis not present

## 2022-04-09 DIAGNOSIS — N186 End stage renal disease: Secondary | ICD-10-CM | POA: Diagnosis not present

## 2022-04-09 DIAGNOSIS — Z23 Encounter for immunization: Secondary | ICD-10-CM | POA: Diagnosis not present

## 2022-04-09 DIAGNOSIS — R197 Diarrhea, unspecified: Secondary | ICD-10-CM | POA: Diagnosis not present

## 2022-04-09 DIAGNOSIS — N2581 Secondary hyperparathyroidism of renal origin: Secondary | ICD-10-CM | POA: Diagnosis not present

## 2022-04-09 DIAGNOSIS — D509 Iron deficiency anemia, unspecified: Secondary | ICD-10-CM | POA: Diagnosis not present

## 2022-04-13 DIAGNOSIS — N186 End stage renal disease: Secondary | ICD-10-CM | POA: Diagnosis not present

## 2022-04-13 DIAGNOSIS — Z23 Encounter for immunization: Secondary | ICD-10-CM | POA: Diagnosis not present

## 2022-04-13 DIAGNOSIS — N2581 Secondary hyperparathyroidism of renal origin: Secondary | ICD-10-CM | POA: Diagnosis not present

## 2022-04-13 DIAGNOSIS — D509 Iron deficiency anemia, unspecified: Secondary | ICD-10-CM | POA: Diagnosis not present

## 2022-04-13 DIAGNOSIS — D631 Anemia in chronic kidney disease: Secondary | ICD-10-CM | POA: Diagnosis not present

## 2022-04-13 DIAGNOSIS — R197 Diarrhea, unspecified: Secondary | ICD-10-CM | POA: Diagnosis not present

## 2022-04-16 DIAGNOSIS — R197 Diarrhea, unspecified: Secondary | ICD-10-CM | POA: Diagnosis not present

## 2022-04-16 DIAGNOSIS — D631 Anemia in chronic kidney disease: Secondary | ICD-10-CM | POA: Diagnosis not present

## 2022-04-16 DIAGNOSIS — N186 End stage renal disease: Secondary | ICD-10-CM | POA: Diagnosis not present

## 2022-04-16 DIAGNOSIS — N2581 Secondary hyperparathyroidism of renal origin: Secondary | ICD-10-CM | POA: Diagnosis not present

## 2022-04-16 DIAGNOSIS — D509 Iron deficiency anemia, unspecified: Secondary | ICD-10-CM | POA: Diagnosis not present

## 2022-04-16 DIAGNOSIS — Z23 Encounter for immunization: Secondary | ICD-10-CM | POA: Diagnosis not present

## 2022-04-18 DIAGNOSIS — D509 Iron deficiency anemia, unspecified: Secondary | ICD-10-CM | POA: Diagnosis not present

## 2022-04-18 DIAGNOSIS — D631 Anemia in chronic kidney disease: Secondary | ICD-10-CM | POA: Diagnosis not present

## 2022-04-18 DIAGNOSIS — Z23 Encounter for immunization: Secondary | ICD-10-CM | POA: Diagnosis not present

## 2022-04-18 DIAGNOSIS — R197 Diarrhea, unspecified: Secondary | ICD-10-CM | POA: Diagnosis not present

## 2022-04-18 DIAGNOSIS — N2581 Secondary hyperparathyroidism of renal origin: Secondary | ICD-10-CM | POA: Diagnosis not present

## 2022-04-18 DIAGNOSIS — N186 End stage renal disease: Secondary | ICD-10-CM | POA: Diagnosis not present

## 2022-04-20 DIAGNOSIS — D509 Iron deficiency anemia, unspecified: Secondary | ICD-10-CM | POA: Diagnosis not present

## 2022-04-20 DIAGNOSIS — D631 Anemia in chronic kidney disease: Secondary | ICD-10-CM | POA: Diagnosis not present

## 2022-04-20 DIAGNOSIS — N2581 Secondary hyperparathyroidism of renal origin: Secondary | ICD-10-CM | POA: Diagnosis not present

## 2022-04-20 DIAGNOSIS — Z23 Encounter for immunization: Secondary | ICD-10-CM | POA: Diagnosis not present

## 2022-04-20 DIAGNOSIS — N186 End stage renal disease: Secondary | ICD-10-CM | POA: Diagnosis not present

## 2022-04-20 DIAGNOSIS — R197 Diarrhea, unspecified: Secondary | ICD-10-CM | POA: Diagnosis not present

## 2022-04-25 DIAGNOSIS — D509 Iron deficiency anemia, unspecified: Secondary | ICD-10-CM | POA: Diagnosis not present

## 2022-04-25 DIAGNOSIS — Z23 Encounter for immunization: Secondary | ICD-10-CM | POA: Diagnosis not present

## 2022-04-25 DIAGNOSIS — R197 Diarrhea, unspecified: Secondary | ICD-10-CM | POA: Diagnosis not present

## 2022-04-25 DIAGNOSIS — N2581 Secondary hyperparathyroidism of renal origin: Secondary | ICD-10-CM | POA: Diagnosis not present

## 2022-04-25 DIAGNOSIS — N186 End stage renal disease: Secondary | ICD-10-CM | POA: Diagnosis not present

## 2022-04-25 DIAGNOSIS — D631 Anemia in chronic kidney disease: Secondary | ICD-10-CM | POA: Diagnosis not present

## 2022-04-27 DIAGNOSIS — N2581 Secondary hyperparathyroidism of renal origin: Secondary | ICD-10-CM | POA: Diagnosis not present

## 2022-04-27 DIAGNOSIS — N186 End stage renal disease: Secondary | ICD-10-CM | POA: Diagnosis not present

## 2022-04-27 DIAGNOSIS — D631 Anemia in chronic kidney disease: Secondary | ICD-10-CM | POA: Diagnosis not present

## 2022-04-27 DIAGNOSIS — R197 Diarrhea, unspecified: Secondary | ICD-10-CM | POA: Diagnosis not present

## 2022-04-27 DIAGNOSIS — Z992 Dependence on renal dialysis: Secondary | ICD-10-CM | POA: Diagnosis not present

## 2022-04-27 DIAGNOSIS — D509 Iron deficiency anemia, unspecified: Secondary | ICD-10-CM | POA: Diagnosis not present

## 2022-04-27 DIAGNOSIS — Z23 Encounter for immunization: Secondary | ICD-10-CM | POA: Diagnosis not present

## 2022-05-06 ENCOUNTER — Other Ambulatory Visit: Payer: Self-pay | Admitting: Family Medicine

## 2022-05-06 DIAGNOSIS — M329 Systemic lupus erythematosus, unspecified: Secondary | ICD-10-CM

## 2022-05-15 ENCOUNTER — Other Ambulatory Visit: Payer: Self-pay | Admitting: Family Medicine

## 2022-05-15 DIAGNOSIS — M329 Systemic lupus erythematosus, unspecified: Secondary | ICD-10-CM

## 2022-05-23 IMAGING — MG MM DIGITAL DIAGNOSTIC UNILAT*L* W/ TOMO W/ CAD
8 series · 9 of 24 positions shown · non-contrast
Comparison: Previous exam(s).

CLINICAL DATA: 65-year-old female for further evaluation of
possible LEFT breast asymmetry on new baseline screening mammogram.

EXAM:
DIGITAL DIAGNOSTIC UNILATERAL LEFT MAMMOGRAM WITH TOMOSYNTHESIS AND
CAD
TECHNIQUE: Left digital diagnostic mammography and breast tomosynthesis was
performed. The images were evaluated with computer-aided detection.

[L MLO synth-2D (1 of 2)]
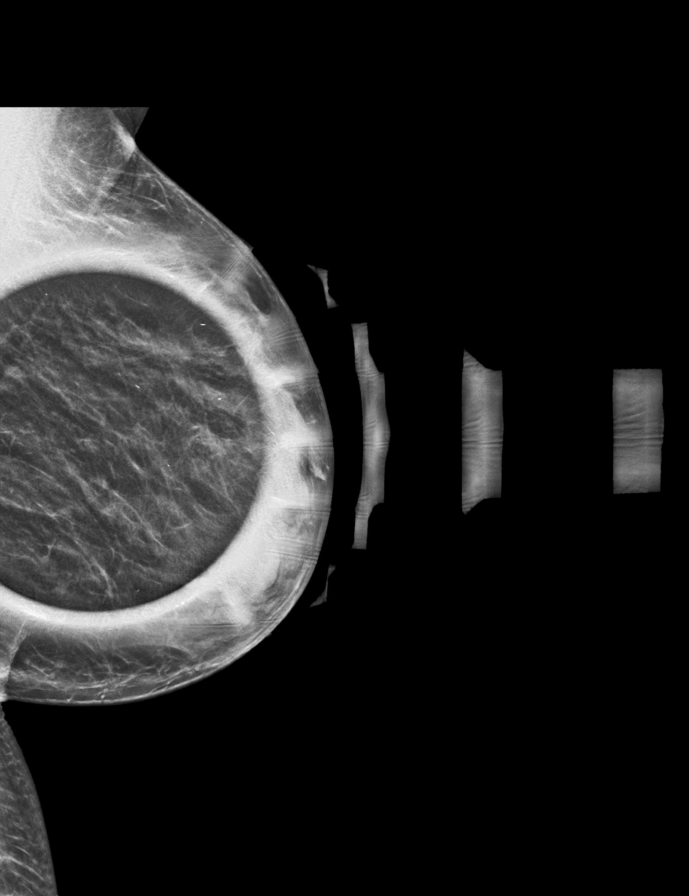

[L MLO synth-2D (2 of 2)]
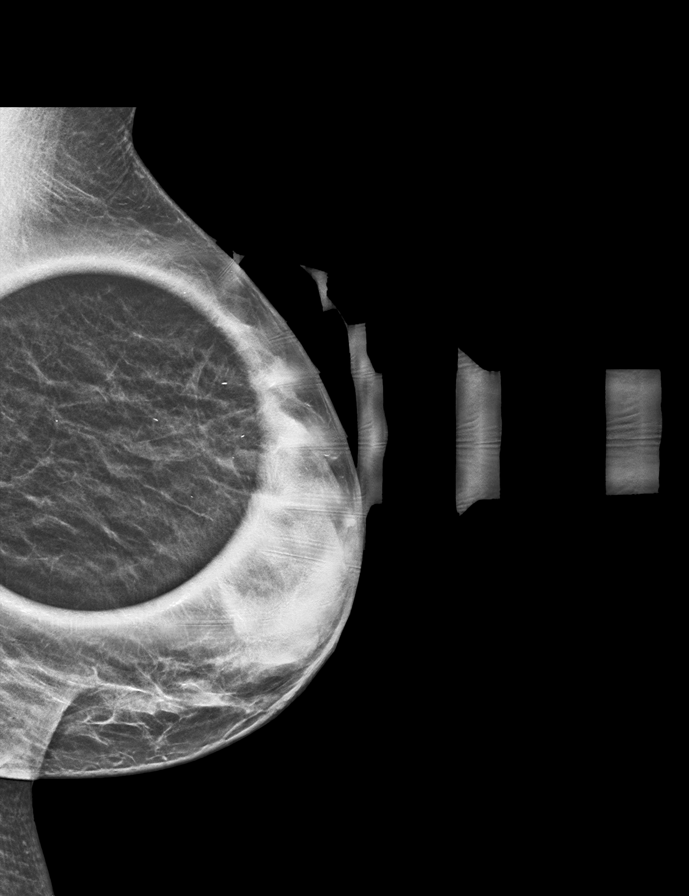

[L CC synth-2D (1 of 2)]
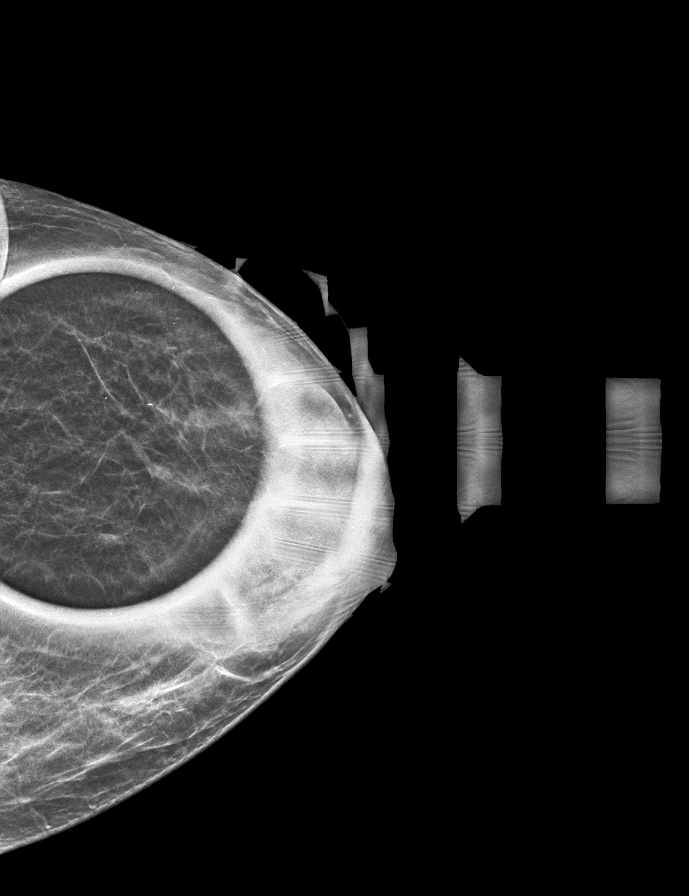

[L CC synth-2D (2 of 2)]
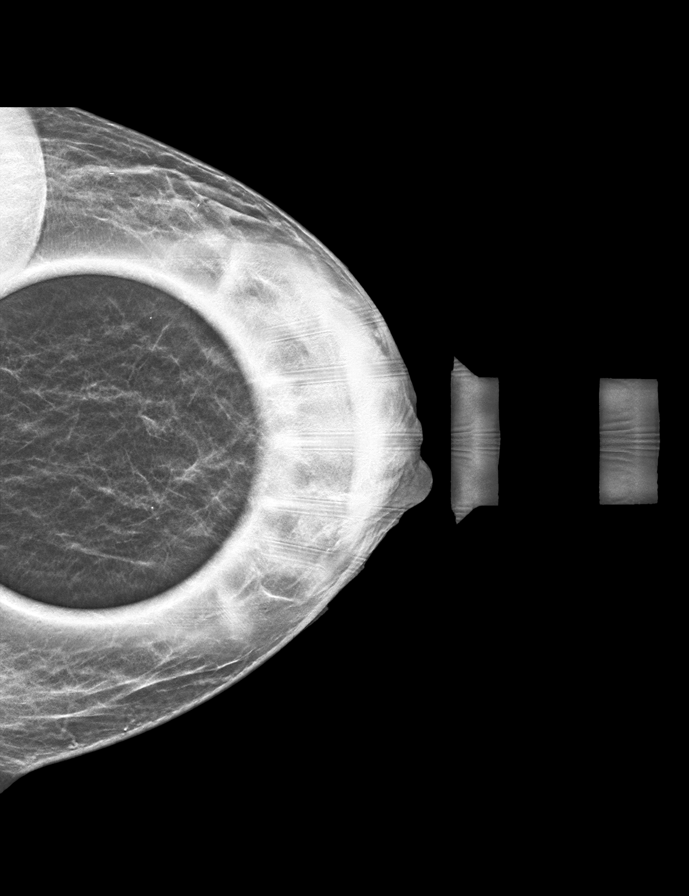

[L MLO tomo · 2 of 34 frames shown (1 of 2)]
[frame 12/34]
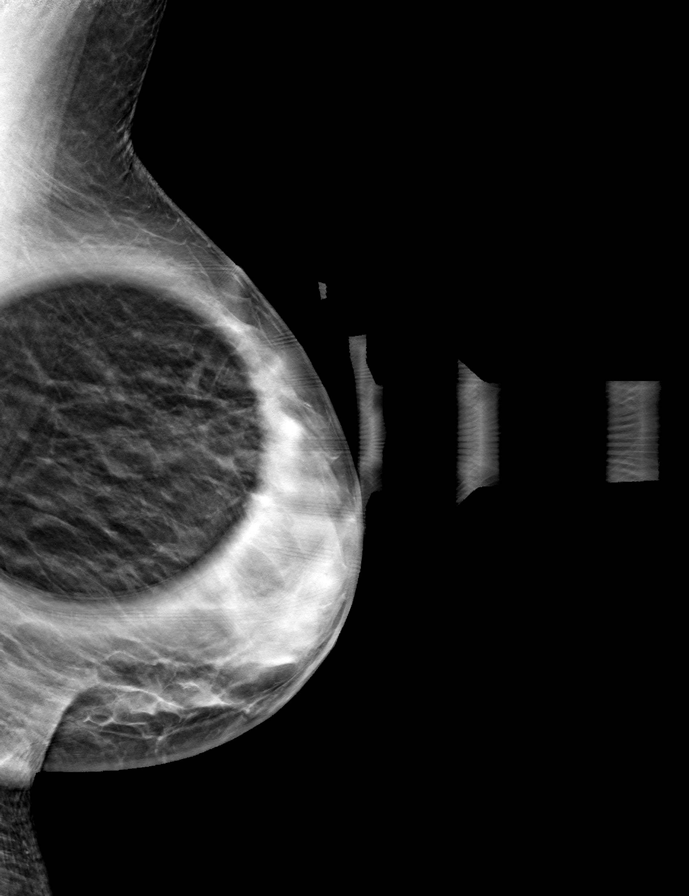
[frame 17/34]
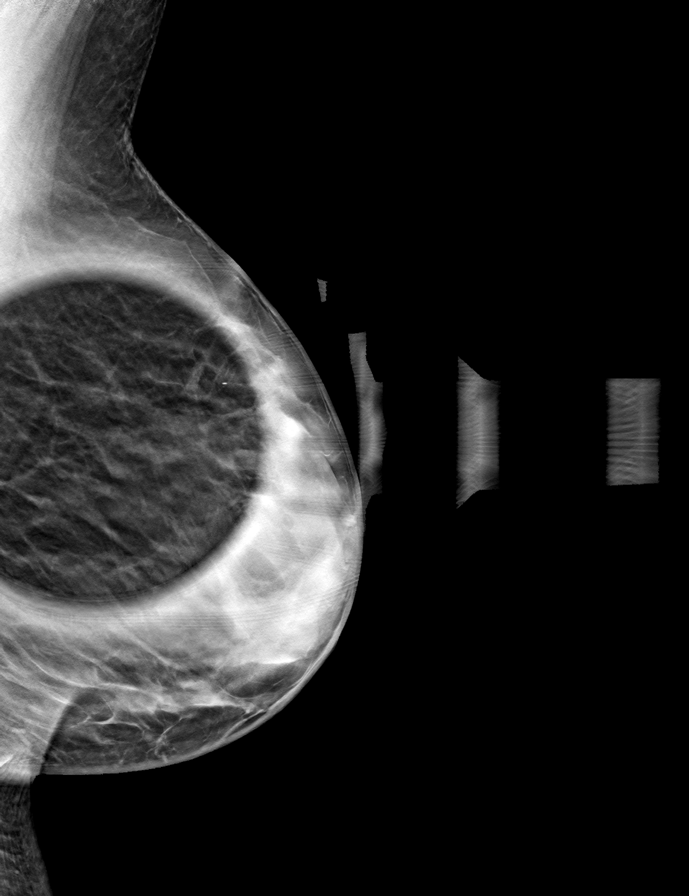

[L CC tomo (1 of 2) · tomo slice 17/33.0]
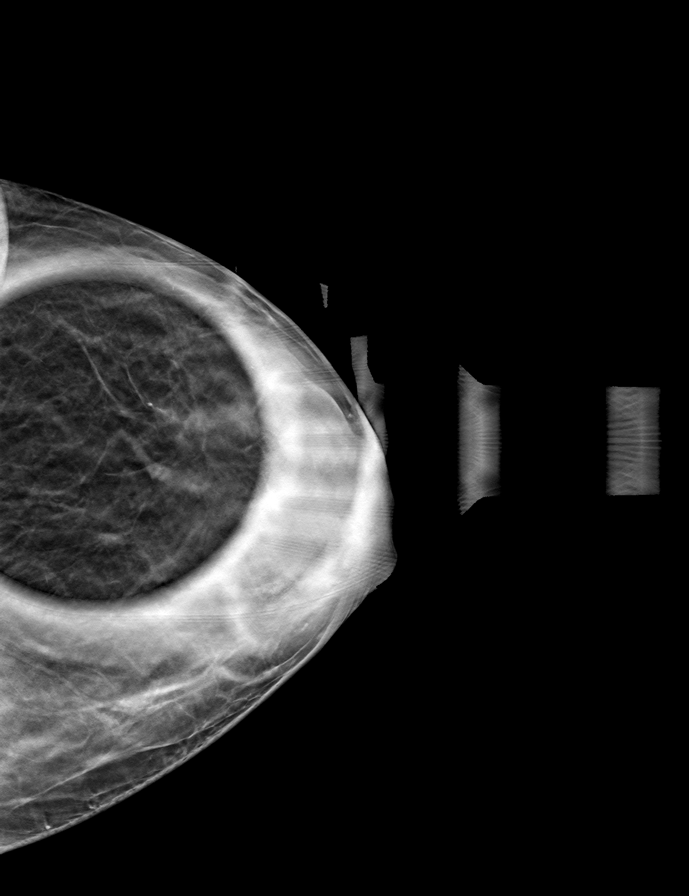

[L CC tomo (2 of 2) · tomo slice 15/30.0]
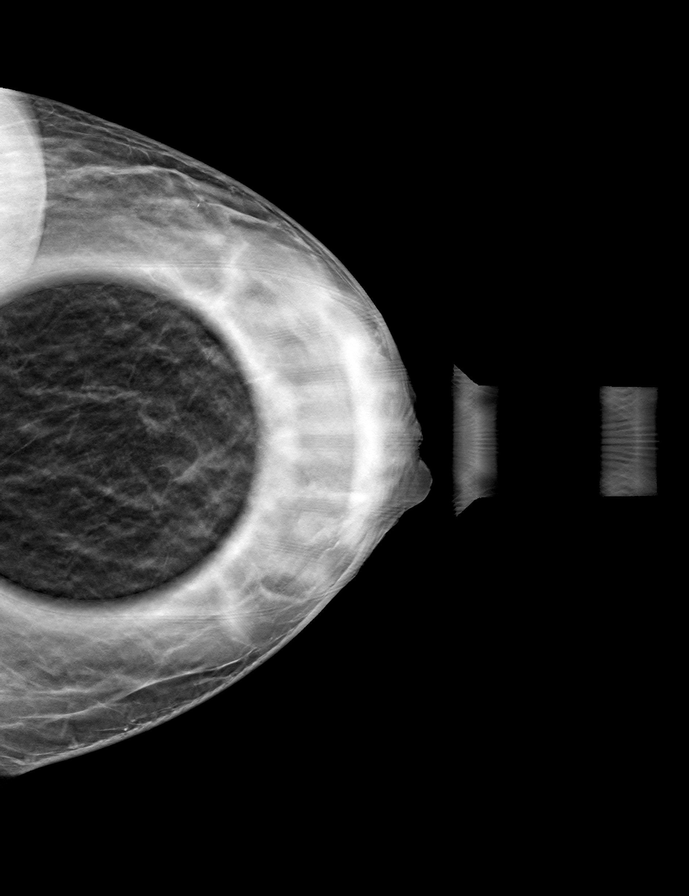

[L MLO tomo (2 of 2) · tomo slice 19/38.0]
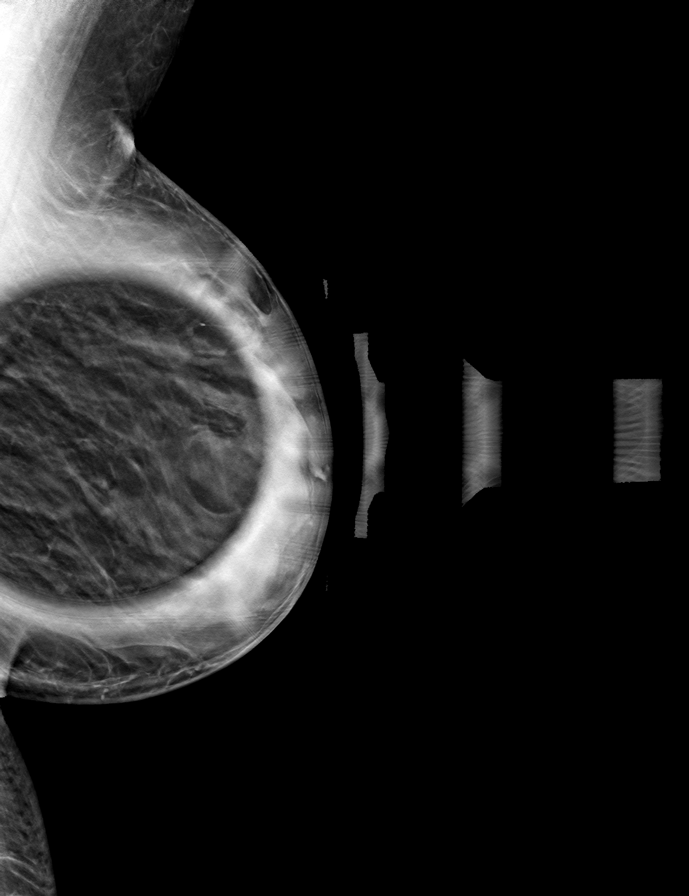

[9 of 24 positions shown; findings below may reference images not displayed]

ACR Breast Density Category c: The breast tissue is heterogeneously
dense, which may obscure small masses.
FINDINGS: 2D/3D spot compression views of the LEFT breast demonstrate
dispersal of the LEFT breast asymmetry without persistent
abnormality in this area.
IMPRESSION: No persistent abnormality in the area of the screening study
finding.

RECOMMENDATION:
Bilateral screening mammogram in 1 year.

I have discussed the findings and recommendations with the patient.
If applicable, a reminder letter will be sent to the patient
regarding the next appointment.

BI-RADS CATEGORY  1: Negative.

## 2023-03-19 ENCOUNTER — Telehealth: Payer: Self-pay

## 2023-03-19 NOTE — Transitions of Care (Post Inpatient/ED Visit) (Signed)
   03/19/2023  Name: Teondra Langin MRN: [id number] DOB: 11-04-1955  Today's TOC FU Call Status: Today's TOC FU Call Status:: Unsuccessful Call (1st Attempt) Unsuccessful Call (1st Attempt) Date: 03/19/23  Attempted to reach the patient regarding the most recent Inpatient/ED visit.  Follow Up Plan: Additional outreach attempts will be made to reach the patient to complete the Transitions of Care (Post Inpatient/ED visit) call.   Josalin Carneiro J. Cristela Felt, RN, BSN, MSN Care Management Coordinator/[place] Phone Number:  [phone]

## 2023-03-20 ENCOUNTER — Telehealth: Payer: Self-pay

## 2023-03-20 NOTE — Transitions of Care (Post Inpatient/ED Visit) (Signed)
   03/20/2023  Name: Karen Carr MRN: [id number] DOB: 06/28/1956  Today's TOC FU Call Status: Today's TOC FU Call Status:: Unsuccessful Call (2nd Attempt) Unsuccessful Call (1st Attempt) Date: 03/19/23 Unsuccessful Call (2nd Attempt) Date: 03/20/23  Attempted to reach the patient regarding the most recent Inpatient/ED visit.  Follow Up Plan: Additional outreach attempts will be made to reach the patient to complete the Transitions of Care (Post Inpatient/ED visit) call.   Gerhard Rappaport J. Mcgwire Dasaro, RN, BSN, MSN Care Management Coordinator/[place] Phone Number:  [phone]

## 2023-03-21 ENCOUNTER — Telehealth: Payer: Self-pay

## 2023-03-21 NOTE — Transitions of Care (Post Inpatient/ED Visit) (Signed)
   03/21/2023  Name: King Tier MRN: [id number] DOB: 1956-04-02  Today's TOC FU Call Status: Today's TOC FU Call Status:: Unsuccessful Call (3rd Attempt) Unsuccessful Call (1st Attempt) Date: 03/19/23 Unsuccessful Call (2nd Attempt) Date: 03/20/23 Unsuccessful Call (3rd Attempt) Date: 03/21/23  Attempted to reach the patient regarding the most recent Inpatient/ED visit.  Follow Up Plan: No further outreach attempts will be made at this time. We have been unable to contact the patient.  Noelia Lenart J. Cristela Felt, RN, BSN, MSN Care Management Coordinator/[place] Phone Number: [phone]

## 2023-11-27 DEATH — deceased
